# Patient Record
Sex: Male | Born: 1945 | Race: White | Hispanic: No | Marital: Married | Smoking: Never smoker
Health system: Southern US, Community
[De-identification: ages and names within clinical notes are randomized; demographics above are authoritative.]

## PROBLEM LIST (undated history)

## (undated) DIAGNOSIS — I25118 Atherosclerotic heart disease of native coronary artery with other forms of angina pectoris: Secondary | ICD-10-CM

## (undated) DIAGNOSIS — K219 Gastro-esophageal reflux disease without esophagitis: Secondary | ICD-10-CM

## (undated) DIAGNOSIS — R519 Headache, unspecified: Secondary | ICD-10-CM

## (undated) DIAGNOSIS — E782 Mixed hyperlipidemia: Secondary | ICD-10-CM

## (undated) HISTORY — DX: Atherosclerotic heart disease of native coronary artery with other forms of angina pectoris: I25.118

## (undated) HISTORY — DX: Gastro-esophageal reflux disease without esophagitis: K21.9

## (undated) HISTORY — DX: Mixed hyperlipidemia: E78.2

---

## 2014-12-04 DIAGNOSIS — Z0100 Encounter for examination of eyes and vision without abnormal findings: Secondary | ICD-10-CM | POA: Diagnosis not present

## 2014-12-06 DIAGNOSIS — H5201 Hypermetropia, right eye: Secondary | ICD-10-CM | POA: Diagnosis not present

## 2014-12-06 DIAGNOSIS — Z0100 Encounter for examination of eyes and vision without abnormal findings: Secondary | ICD-10-CM | POA: Diagnosis not present

## 2014-12-27 DIAGNOSIS — M25511 Pain in right shoulder: Secondary | ICD-10-CM | POA: Diagnosis not present

## 2015-01-03 DIAGNOSIS — M19019 Primary osteoarthritis, unspecified shoulder: Secondary | ICD-10-CM | POA: Diagnosis not present

## 2015-01-03 DIAGNOSIS — S46819A Strain of other muscles, fascia and tendons at shoulder and upper arm level, unspecified arm, initial encounter: Secondary | ICD-10-CM | POA: Diagnosis not present

## 2015-01-03 DIAGNOSIS — M25511 Pain in right shoulder: Secondary | ICD-10-CM | POA: Diagnosis not present

## 2015-01-03 DIAGNOSIS — M779 Enthesopathy, unspecified: Secondary | ICD-10-CM | POA: Diagnosis not present

## 2015-01-03 DIAGNOSIS — M75120 Complete rotator cuff tear or rupture of unspecified shoulder, not specified as traumatic: Secondary | ICD-10-CM | POA: Diagnosis not present

## 2015-01-12 DIAGNOSIS — M75121 Complete rotator cuff tear or rupture of right shoulder, not specified as traumatic: Secondary | ICD-10-CM | POA: Diagnosis not present

## 2015-01-29 DIAGNOSIS — Z01810 Encounter for preprocedural cardiovascular examination: Secondary | ICD-10-CM | POA: Diagnosis not present

## 2015-01-29 DIAGNOSIS — Z01818 Encounter for other preprocedural examination: Secondary | ICD-10-CM | POA: Diagnosis not present

## 2015-01-29 DIAGNOSIS — I10 Essential (primary) hypertension: Secondary | ICD-10-CM | POA: Diagnosis not present

## 2015-01-30 DIAGNOSIS — M24111 Other articular cartilage disorders, right shoulder: Secondary | ICD-10-CM | POA: Diagnosis not present

## 2015-01-30 DIAGNOSIS — S46111A Strain of muscle, fascia and tendon of long head of biceps, right arm, initial encounter: Secondary | ICD-10-CM | POA: Diagnosis not present

## 2015-01-30 DIAGNOSIS — M65811 Other synovitis and tenosynovitis, right shoulder: Secondary | ICD-10-CM | POA: Diagnosis not present

## 2015-01-30 DIAGNOSIS — M6788 Other specified disorders of synovium and tendon, other site: Secondary | ICD-10-CM | POA: Diagnosis not present

## 2015-01-30 DIAGNOSIS — M659 Synovitis and tenosynovitis, unspecified: Secondary | ICD-10-CM | POA: Diagnosis not present

## 2015-01-30 DIAGNOSIS — G8918 Other acute postprocedural pain: Secondary | ICD-10-CM | POA: Diagnosis not present

## 2015-01-30 DIAGNOSIS — S43431A Superior glenoid labrum lesion of right shoulder, initial encounter: Secondary | ICD-10-CM | POA: Diagnosis not present

## 2015-01-30 DIAGNOSIS — M75121 Complete rotator cuff tear or rupture of right shoulder, not specified as traumatic: Secondary | ICD-10-CM | POA: Diagnosis not present

## 2015-01-30 DIAGNOSIS — S46011A Strain of muscle(s) and tendon(s) of the rotator cuff of right shoulder, initial encounter: Secondary | ICD-10-CM | POA: Diagnosis not present

## 2015-01-30 DIAGNOSIS — E7800 Pure hypercholesterolemia, unspecified: Secondary | ICD-10-CM | POA: Diagnosis not present

## 2015-01-30 DIAGNOSIS — Z79899 Other long term (current) drug therapy: Secondary | ICD-10-CM | POA: Diagnosis not present

## 2015-01-30 DIAGNOSIS — M19011 Primary osteoarthritis, right shoulder: Secondary | ICD-10-CM | POA: Diagnosis not present

## 2015-01-30 DIAGNOSIS — K219 Gastro-esophageal reflux disease without esophagitis: Secondary | ICD-10-CM | POA: Diagnosis not present

## 2015-02-02 DIAGNOSIS — M6281 Muscle weakness (generalized): Secondary | ICD-10-CM | POA: Diagnosis not present

## 2015-02-02 DIAGNOSIS — M25511 Pain in right shoulder: Secondary | ICD-10-CM | POA: Diagnosis not present

## 2015-02-07 HISTORY — PX: ROTATOR CUFF REPAIR: SHX139

## 2015-02-23 DIAGNOSIS — Z9889 Other specified postprocedural states: Secondary | ICD-10-CM | POA: Diagnosis not present

## 2015-02-27 DIAGNOSIS — M25511 Pain in right shoulder: Secondary | ICD-10-CM | POA: Diagnosis not present

## 2015-02-27 DIAGNOSIS — M6281 Muscle weakness (generalized): Secondary | ICD-10-CM | POA: Diagnosis not present

## 2015-03-06 DIAGNOSIS — M6281 Muscle weakness (generalized): Secondary | ICD-10-CM | POA: Diagnosis not present

## 2015-03-06 DIAGNOSIS — M25511 Pain in right shoulder: Secondary | ICD-10-CM | POA: Diagnosis not present

## 2015-03-08 DIAGNOSIS — M6281 Muscle weakness (generalized): Secondary | ICD-10-CM | POA: Diagnosis not present

## 2015-03-08 DIAGNOSIS — M25511 Pain in right shoulder: Secondary | ICD-10-CM | POA: Diagnosis not present

## 2015-03-13 DIAGNOSIS — M25511 Pain in right shoulder: Secondary | ICD-10-CM | POA: Diagnosis not present

## 2015-03-13 DIAGNOSIS — M6281 Muscle weakness (generalized): Secondary | ICD-10-CM | POA: Diagnosis not present

## 2015-03-15 DIAGNOSIS — M6281 Muscle weakness (generalized): Secondary | ICD-10-CM | POA: Diagnosis not present

## 2015-03-15 DIAGNOSIS — M25511 Pain in right shoulder: Secondary | ICD-10-CM | POA: Diagnosis not present

## 2015-03-20 DIAGNOSIS — M25511 Pain in right shoulder: Secondary | ICD-10-CM | POA: Diagnosis not present

## 2015-03-20 DIAGNOSIS — M6281 Muscle weakness (generalized): Secondary | ICD-10-CM | POA: Diagnosis not present

## 2015-03-22 DIAGNOSIS — M25511 Pain in right shoulder: Secondary | ICD-10-CM | POA: Diagnosis not present

## 2015-03-22 DIAGNOSIS — M6281 Muscle weakness (generalized): Secondary | ICD-10-CM | POA: Diagnosis not present

## 2015-03-27 DIAGNOSIS — M6281 Muscle weakness (generalized): Secondary | ICD-10-CM | POA: Diagnosis not present

## 2015-03-27 DIAGNOSIS — M25511 Pain in right shoulder: Secondary | ICD-10-CM | POA: Diagnosis not present

## 2015-03-29 DIAGNOSIS — M25511 Pain in right shoulder: Secondary | ICD-10-CM | POA: Diagnosis not present

## 2015-03-29 DIAGNOSIS — M6281 Muscle weakness (generalized): Secondary | ICD-10-CM | POA: Diagnosis not present

## 2015-04-03 DIAGNOSIS — M6281 Muscle weakness (generalized): Secondary | ICD-10-CM | POA: Diagnosis not present

## 2015-04-03 DIAGNOSIS — M25511 Pain in right shoulder: Secondary | ICD-10-CM | POA: Diagnosis not present

## 2015-04-05 DIAGNOSIS — M25511 Pain in right shoulder: Secondary | ICD-10-CM | POA: Diagnosis not present

## 2015-04-05 DIAGNOSIS — M6281 Muscle weakness (generalized): Secondary | ICD-10-CM | POA: Diagnosis not present

## 2015-04-10 DIAGNOSIS — M25511 Pain in right shoulder: Secondary | ICD-10-CM | POA: Diagnosis not present

## 2015-04-10 DIAGNOSIS — M6281 Muscle weakness (generalized): Secondary | ICD-10-CM | POA: Diagnosis not present

## 2015-04-12 DIAGNOSIS — M25511 Pain in right shoulder: Secondary | ICD-10-CM | POA: Diagnosis not present

## 2015-04-12 DIAGNOSIS — M6281 Muscle weakness (generalized): Secondary | ICD-10-CM | POA: Diagnosis not present

## 2015-04-17 DIAGNOSIS — M6281 Muscle weakness (generalized): Secondary | ICD-10-CM | POA: Diagnosis not present

## 2015-04-17 DIAGNOSIS — M25511 Pain in right shoulder: Secondary | ICD-10-CM | POA: Diagnosis not present

## 2015-04-19 DIAGNOSIS — M6281 Muscle weakness (generalized): Secondary | ICD-10-CM | POA: Diagnosis not present

## 2015-04-19 DIAGNOSIS — M25511 Pain in right shoulder: Secondary | ICD-10-CM | POA: Diagnosis not present

## 2015-05-16 DIAGNOSIS — M75121 Complete rotator cuff tear or rupture of right shoulder, not specified as traumatic: Secondary | ICD-10-CM | POA: Diagnosis not present

## 2015-05-16 DIAGNOSIS — Z9889 Other specified postprocedural states: Secondary | ICD-10-CM | POA: Diagnosis not present

## 2015-05-31 DIAGNOSIS — J209 Acute bronchitis, unspecified: Secondary | ICD-10-CM | POA: Diagnosis not present

## 2015-08-01 DIAGNOSIS — Z0000 Encounter for general adult medical examination without abnormal findings: Secondary | ICD-10-CM | POA: Diagnosis not present

## 2015-08-01 DIAGNOSIS — Z79899 Other long term (current) drug therapy: Secondary | ICD-10-CM | POA: Diagnosis not present

## 2015-08-01 DIAGNOSIS — Z23 Encounter for immunization: Secondary | ICD-10-CM | POA: Diagnosis not present

## 2015-08-01 DIAGNOSIS — Z125 Encounter for screening for malignant neoplasm of prostate: Secondary | ICD-10-CM | POA: Diagnosis not present

## 2015-08-01 DIAGNOSIS — E782 Mixed hyperlipidemia: Secondary | ICD-10-CM | POA: Diagnosis not present

## 2015-08-01 DIAGNOSIS — Z1389 Encounter for screening for other disorder: Secondary | ICD-10-CM | POA: Diagnosis not present

## 2015-08-01 DIAGNOSIS — R55 Syncope and collapse: Secondary | ICD-10-CM | POA: Diagnosis not present

## 2015-08-01 DIAGNOSIS — Z9181 History of falling: Secondary | ICD-10-CM | POA: Diagnosis not present

## 2015-08-09 DIAGNOSIS — E782 Mixed hyperlipidemia: Secondary | ICD-10-CM | POA: Diagnosis not present

## 2015-09-03 DIAGNOSIS — E785 Hyperlipidemia, unspecified: Secondary | ICD-10-CM | POA: Diagnosis not present

## 2015-09-03 DIAGNOSIS — K219 Gastro-esophageal reflux disease without esophagitis: Secondary | ICD-10-CM | POA: Diagnosis not present

## 2015-09-03 DIAGNOSIS — Z0000 Encounter for general adult medical examination without abnormal findings: Secondary | ICD-10-CM | POA: Diagnosis not present

## 2016-02-06 DIAGNOSIS — M25512 Pain in left shoulder: Secondary | ICD-10-CM | POA: Diagnosis not present

## 2016-02-07 DIAGNOSIS — M66812 Spontaneous rupture of other tendons, left shoulder: Secondary | ICD-10-CM | POA: Diagnosis not present

## 2016-02-07 DIAGNOSIS — X58XXXA Exposure to other specified factors, initial encounter: Secondary | ICD-10-CM | POA: Diagnosis not present

## 2016-02-07 DIAGNOSIS — M65812 Other synovitis and tenosynovitis, left shoulder: Secondary | ICD-10-CM | POA: Diagnosis not present

## 2016-02-07 DIAGNOSIS — S46812A Strain of other muscles, fascia and tendons at shoulder and upper arm level, left arm, initial encounter: Secondary | ICD-10-CM | POA: Diagnosis not present

## 2016-02-11 DIAGNOSIS — M25512 Pain in left shoulder: Secondary | ICD-10-CM | POA: Diagnosis not present

## 2016-02-11 DIAGNOSIS — M75122 Complete rotator cuff tear or rupture of left shoulder, not specified as traumatic: Secondary | ICD-10-CM | POA: Diagnosis not present

## 2016-02-11 DIAGNOSIS — M24112 Other articular cartilage disorders, left shoulder: Secondary | ICD-10-CM | POA: Diagnosis not present

## 2016-02-11 DIAGNOSIS — M19012 Primary osteoarthritis, left shoulder: Secondary | ICD-10-CM | POA: Diagnosis not present

## 2016-02-11 DIAGNOSIS — S46212A Strain of muscle, fascia and tendon of other parts of biceps, left arm, initial encounter: Secondary | ICD-10-CM | POA: Diagnosis not present

## 2016-02-13 DIAGNOSIS — M25512 Pain in left shoulder: Secondary | ICD-10-CM | POA: Diagnosis not present

## 2016-03-10 DIAGNOSIS — S46212A Strain of muscle, fascia and tendon of other parts of biceps, left arm, initial encounter: Secondary | ICD-10-CM | POA: Diagnosis not present

## 2016-03-10 DIAGNOSIS — M24112 Other articular cartilage disorders, left shoulder: Secondary | ICD-10-CM | POA: Diagnosis not present

## 2016-03-10 DIAGNOSIS — M75122 Complete rotator cuff tear or rupture of left shoulder, not specified as traumatic: Secondary | ICD-10-CM | POA: Diagnosis not present

## 2016-03-10 DIAGNOSIS — M19012 Primary osteoarthritis, left shoulder: Secondary | ICD-10-CM | POA: Diagnosis not present

## 2016-03-25 DIAGNOSIS — H524 Presbyopia: Secondary | ICD-10-CM | POA: Diagnosis not present

## 2016-03-25 DIAGNOSIS — Z0100 Encounter for examination of eyes and vision without abnormal findings: Secondary | ICD-10-CM | POA: Diagnosis not present

## 2016-04-29 DIAGNOSIS — E782 Mixed hyperlipidemia: Secondary | ICD-10-CM | POA: Diagnosis not present

## 2016-04-29 DIAGNOSIS — Z79899 Other long term (current) drug therapy: Secondary | ICD-10-CM | POA: Diagnosis not present

## 2016-05-05 DIAGNOSIS — Z1211 Encounter for screening for malignant neoplasm of colon: Secondary | ICD-10-CM | POA: Diagnosis not present

## 2016-05-05 DIAGNOSIS — Z6825 Body mass index (BMI) 25.0-25.9, adult: Secondary | ICD-10-CM | POA: Diagnosis not present

## 2016-05-05 DIAGNOSIS — K219 Gastro-esophageal reflux disease without esophagitis: Secondary | ICD-10-CM | POA: Diagnosis not present

## 2016-05-05 DIAGNOSIS — E782 Mixed hyperlipidemia: Secondary | ICD-10-CM | POA: Diagnosis not present

## 2016-05-12 DIAGNOSIS — M19012 Primary osteoarthritis, left shoulder: Secondary | ICD-10-CM | POA: Diagnosis not present

## 2016-05-12 DIAGNOSIS — M75122 Complete rotator cuff tear or rupture of left shoulder, not specified as traumatic: Secondary | ICD-10-CM | POA: Diagnosis not present

## 2016-06-04 DIAGNOSIS — J0100 Acute maxillary sinusitis, unspecified: Secondary | ICD-10-CM | POA: Diagnosis not present

## 2016-06-04 DIAGNOSIS — J209 Acute bronchitis, unspecified: Secondary | ICD-10-CM | POA: Diagnosis not present

## 2016-06-26 DIAGNOSIS — J069 Acute upper respiratory infection, unspecified: Secondary | ICD-10-CM | POA: Diagnosis not present

## 2016-06-26 DIAGNOSIS — J301 Allergic rhinitis due to pollen: Secondary | ICD-10-CM | POA: Diagnosis not present

## 2016-07-07 DIAGNOSIS — Z6823 Body mass index (BMI) 23.0-23.9, adult: Secondary | ICD-10-CM | POA: Diagnosis not present

## 2016-07-07 DIAGNOSIS — K648 Other hemorrhoids: Secondary | ICD-10-CM | POA: Diagnosis not present

## 2016-07-07 DIAGNOSIS — K625 Hemorrhage of anus and rectum: Secondary | ICD-10-CM | POA: Diagnosis not present

## 2016-07-07 DIAGNOSIS — R252 Cramp and spasm: Secondary | ICD-10-CM | POA: Diagnosis not present

## 2016-08-06 HISTORY — PX: COLONOSCOPY: SHX174

## 2016-08-13 DIAGNOSIS — Z1211 Encounter for screening for malignant neoplasm of colon: Secondary | ICD-10-CM | POA: Diagnosis not present

## 2016-08-13 DIAGNOSIS — Z800 Family history of malignant neoplasm of digestive organs: Secondary | ICD-10-CM | POA: Diagnosis not present

## 2016-08-13 DIAGNOSIS — K635 Polyp of colon: Secondary | ICD-10-CM | POA: Diagnosis not present

## 2016-08-13 DIAGNOSIS — Z86010 Personal history of colonic polyps: Secondary | ICD-10-CM | POA: Diagnosis not present

## 2016-08-13 DIAGNOSIS — D125 Benign neoplasm of sigmoid colon: Secondary | ICD-10-CM | POA: Diagnosis not present

## 2016-09-10 DIAGNOSIS — M75122 Complete rotator cuff tear or rupture of left shoulder, not specified as traumatic: Secondary | ICD-10-CM | POA: Diagnosis not present

## 2016-09-10 DIAGNOSIS — M25512 Pain in left shoulder: Secondary | ICD-10-CM | POA: Diagnosis not present

## 2016-09-18 DIAGNOSIS — Z791 Long term (current) use of non-steroidal anti-inflammatories (NSAID): Secondary | ICD-10-CM | POA: Diagnosis not present

## 2016-09-18 DIAGNOSIS — K219 Gastro-esophageal reflux disease without esophagitis: Secondary | ICD-10-CM | POA: Diagnosis not present

## 2016-09-18 DIAGNOSIS — K5900 Constipation, unspecified: Secondary | ICD-10-CM | POA: Diagnosis not present

## 2016-09-18 DIAGNOSIS — Z79899 Other long term (current) drug therapy: Secondary | ICD-10-CM | POA: Diagnosis not present

## 2016-09-18 DIAGNOSIS — E785 Hyperlipidemia, unspecified: Secondary | ICD-10-CM | POA: Diagnosis not present

## 2016-09-18 DIAGNOSIS — Z6825 Body mass index (BMI) 25.0-25.9, adult: Secondary | ICD-10-CM | POA: Diagnosis not present

## 2016-09-18 DIAGNOSIS — Z8669 Personal history of other diseases of the nervous system and sense organs: Secondary | ICD-10-CM | POA: Diagnosis not present

## 2016-09-18 DIAGNOSIS — Z0000 Encounter for general adult medical examination without abnormal findings: Secondary | ICD-10-CM | POA: Diagnosis not present

## 2016-09-18 DIAGNOSIS — Z8739 Personal history of other diseases of the musculoskeletal system and connective tissue: Secondary | ICD-10-CM | POA: Diagnosis not present

## 2016-09-18 DIAGNOSIS — G8921 Chronic pain due to trauma: Secondary | ICD-10-CM | POA: Diagnosis not present

## 2016-09-26 DIAGNOSIS — A932 Colorado tick fever: Secondary | ICD-10-CM | POA: Diagnosis not present

## 2017-05-24 DIAGNOSIS — I951 Orthostatic hypotension: Secondary | ICD-10-CM | POA: Diagnosis not present

## 2017-05-24 DIAGNOSIS — E860 Dehydration: Secondary | ICD-10-CM | POA: Diagnosis not present

## 2017-05-24 DIAGNOSIS — R197 Diarrhea, unspecified: Secondary | ICD-10-CM | POA: Diagnosis not present

## 2017-05-24 DIAGNOSIS — R5383 Other fatigue: Secondary | ICD-10-CM | POA: Diagnosis not present

## 2017-05-24 DIAGNOSIS — R112 Nausea with vomiting, unspecified: Secondary | ICD-10-CM | POA: Diagnosis not present

## 2017-05-24 DIAGNOSIS — R61 Generalized hyperhidrosis: Secondary | ICD-10-CM | POA: Diagnosis not present

## 2017-05-27 DIAGNOSIS — K529 Noninfective gastroenteritis and colitis, unspecified: Secondary | ICD-10-CM | POA: Diagnosis not present

## 2017-05-27 DIAGNOSIS — Z6823 Body mass index (BMI) 23.0-23.9, adult: Secondary | ICD-10-CM | POA: Diagnosis not present

## 2017-07-30 DIAGNOSIS — Z1339 Encounter for screening examination for other mental health and behavioral disorders: Secondary | ICD-10-CM | POA: Diagnosis not present

## 2017-07-30 DIAGNOSIS — K219 Gastro-esophageal reflux disease without esophagitis: Secondary | ICD-10-CM | POA: Diagnosis not present

## 2017-07-30 DIAGNOSIS — E782 Mixed hyperlipidemia: Secondary | ICD-10-CM | POA: Diagnosis not present

## 2017-07-30 DIAGNOSIS — Z125 Encounter for screening for malignant neoplasm of prostate: Secondary | ICD-10-CM | POA: Diagnosis not present

## 2017-07-30 DIAGNOSIS — Z0000 Encounter for general adult medical examination without abnormal findings: Secondary | ICD-10-CM | POA: Diagnosis not present

## 2017-07-30 DIAGNOSIS — Z1331 Encounter for screening for depression: Secondary | ICD-10-CM | POA: Diagnosis not present

## 2017-07-30 DIAGNOSIS — L570 Actinic keratosis: Secondary | ICD-10-CM | POA: Diagnosis not present

## 2017-11-02 DIAGNOSIS — S91339A Puncture wound without foreign body, unspecified foot, initial encounter: Secondary | ICD-10-CM | POA: Diagnosis not present

## 2018-02-03 DIAGNOSIS — H52221 Regular astigmatism, right eye: Secondary | ICD-10-CM | POA: Diagnosis not present

## 2018-06-08 DIAGNOSIS — R430 Anosmia: Secondary | ICD-10-CM | POA: Diagnosis not present

## 2018-06-08 DIAGNOSIS — J340 Abscess, furuncle and carbuncle of nose: Secondary | ICD-10-CM | POA: Diagnosis not present

## 2018-06-08 DIAGNOSIS — Z6824 Body mass index (BMI) 24.0-24.9, adult: Secondary | ICD-10-CM | POA: Diagnosis not present

## 2018-06-08 DIAGNOSIS — J329 Chronic sinusitis, unspecified: Secondary | ICD-10-CM | POA: Diagnosis not present

## 2019-02-01 DIAGNOSIS — E663 Overweight: Secondary | ICD-10-CM | POA: Diagnosis not present

## 2019-02-01 DIAGNOSIS — Z1331 Encounter for screening for depression: Secondary | ICD-10-CM | POA: Diagnosis not present

## 2019-02-01 DIAGNOSIS — Z0000 Encounter for general adult medical examination without abnormal findings: Secondary | ICD-10-CM | POA: Diagnosis not present

## 2019-02-01 DIAGNOSIS — Z6825 Body mass index (BMI) 25.0-25.9, adult: Secondary | ICD-10-CM | POA: Diagnosis not present

## 2019-02-01 DIAGNOSIS — E782 Mixed hyperlipidemia: Secondary | ICD-10-CM | POA: Diagnosis not present

## 2019-02-01 DIAGNOSIS — Z79899 Other long term (current) drug therapy: Secondary | ICD-10-CM | POA: Diagnosis not present

## 2019-02-01 DIAGNOSIS — R739 Hyperglycemia, unspecified: Secondary | ICD-10-CM | POA: Diagnosis not present

## 2019-02-01 DIAGNOSIS — Z125 Encounter for screening for malignant neoplasm of prostate: Secondary | ICD-10-CM | POA: Diagnosis not present

## 2019-02-01 DIAGNOSIS — Z9181 History of falling: Secondary | ICD-10-CM | POA: Diagnosis not present

## 2019-02-01 DIAGNOSIS — K219 Gastro-esophageal reflux disease without esophagitis: Secondary | ICD-10-CM | POA: Diagnosis not present

## 2019-03-29 DIAGNOSIS — M7702 Medial epicondylitis, left elbow: Secondary | ICD-10-CM | POA: Diagnosis not present

## 2019-03-29 DIAGNOSIS — G5602 Carpal tunnel syndrome, left upper limb: Secondary | ICD-10-CM | POA: Diagnosis not present

## 2019-04-07 DIAGNOSIS — M6281 Muscle weakness (generalized): Secondary | ICD-10-CM | POA: Diagnosis not present

## 2019-04-07 DIAGNOSIS — M25522 Pain in left elbow: Secondary | ICD-10-CM | POA: Diagnosis not present

## 2019-05-25 DIAGNOSIS — M7702 Medial epicondylitis, left elbow: Secondary | ICD-10-CM | POA: Diagnosis not present

## 2019-07-15 DIAGNOSIS — Z111 Encounter for screening for respiratory tuberculosis: Secondary | ICD-10-CM | POA: Diagnosis not present

## 2019-09-28 DIAGNOSIS — L570 Actinic keratosis: Secondary | ICD-10-CM | POA: Diagnosis not present

## 2019-09-28 DIAGNOSIS — L720 Epidermal cyst: Secondary | ICD-10-CM | POA: Diagnosis not present

## 2019-09-28 DIAGNOSIS — D485 Neoplasm of uncertain behavior of skin: Secondary | ICD-10-CM | POA: Diagnosis not present

## 2020-04-19 DIAGNOSIS — L570 Actinic keratosis: Secondary | ICD-10-CM | POA: Diagnosis not present

## 2021-02-26 DIAGNOSIS — Z6824 Body mass index (BMI) 24.0-24.9, adult: Secondary | ICD-10-CM | POA: Diagnosis not present

## 2021-02-26 DIAGNOSIS — I4510 Unspecified right bundle-branch block: Secondary | ICD-10-CM | POA: Diagnosis not present

## 2021-02-26 DIAGNOSIS — R42 Dizziness and giddiness: Secondary | ICD-10-CM | POA: Diagnosis not present

## 2021-02-26 DIAGNOSIS — M5412 Radiculopathy, cervical region: Secondary | ICD-10-CM | POA: Diagnosis not present

## 2021-03-14 DIAGNOSIS — Z961 Presence of intraocular lens: Secondary | ICD-10-CM | POA: Diagnosis not present

## 2021-03-14 DIAGNOSIS — H524 Presbyopia: Secondary | ICD-10-CM | POA: Diagnosis not present

## 2021-03-27 DIAGNOSIS — D485 Neoplasm of uncertain behavior of skin: Secondary | ICD-10-CM | POA: Diagnosis not present

## 2021-03-27 DIAGNOSIS — L720 Epidermal cyst: Secondary | ICD-10-CM | POA: Diagnosis not present

## 2021-04-02 ENCOUNTER — Ambulatory Visit: Payer: Medicare HMO | Admitting: Cardiology

## 2021-04-04 DIAGNOSIS — L720 Epidermal cyst: Secondary | ICD-10-CM | POA: Diagnosis not present

## 2021-04-06 HISTORY — PX: CYSTECTOMY: SUR359

## 2021-04-12 DIAGNOSIS — K219 Gastro-esophageal reflux disease without esophagitis: Secondary | ICD-10-CM | POA: Insufficient documentation

## 2021-04-12 DIAGNOSIS — E782 Mixed hyperlipidemia: Secondary | ICD-10-CM | POA: Insufficient documentation

## 2021-04-14 NOTE — Progress Notes (Signed)
?Cardiology Office Note:   ? ?Date:  04/15/2021  ? ?ID:  Derek Wood, DOB [birth year], MRN [id number] ? ?PCP:  Street, Sharon Mt, MD  ?Cardiologist:  Shirlee More, MD  ? ?Referring MD: Street, Sharon Mt, * ? ?ASSESSMENT:   ? ?1. Chest pain on exertion   ?2. Right bundle branch block   ?3. Mixed hyperlipidemia   ? ?PLAN:   ? ?In order of problems listed above: ? ?Although he is referred for right bundle branch block is concerning his wife's concern is his exertional chest pain which seems to be classic angina in a stable pattern.  We discussed further evaluation and after reviewing functional testing versus CTA he will undergo CTA in [facility] with no dye allergy will give [place] information about coronary calcium score to consider restarting lipid-lowering therapy presence and severity of CAD to guide therapy. ?Generally right bundle branch block by itself does not progress onto conduction system disease needing a pacemaker.  He is asymptomatic. ?Recheck lipid profile weight CTA coronary calcium score ? ?Next appointment 6 to 8 weeks ? ? ?Medication Adjustments/Labs and Tests Ordered: ?Current medicines are reviewed at length with the patient today.  Concerns regarding medicines are outlined above.  ?No orders of the defined types were placed in this encounter. ? ?No orders of the defined types were placed in this encounter. ?  ? ?Chief Complaint  ?Patient presents with  ? Abnormal ECG  ?  Office visit for dizziness was found to have right bundle branch block  ? ?They are concerned regarding his exertional chest pain ?History of Present Illness:   ? ?Derek Wood is a 76 y.o. male who is being seen today for the evaluation of EKG abnormality with right bundle branch block at the request of [address] Sharon Mt, *. ?Office visit 02/27/2019 was for dizziness.  He describes this is positional and vertigo. ?Chart review shows no previous cardiology evaluation EKG or cardiac diagnostic testing in epic ? ?His wife is  present participates in evaluation decision making ?She has been quite concerned finally convinced him to come and be seen ?He has seen his family doctor with lightheadedness when he stood not severe but he has had vertigo in the past. ?He is not sure if he is ever had an EKG done in his adult life before this ?He has had no palpitations syncope no history of congenital rheumatic heart disease or arrhythmia. ?Their concern is that he has chest discomfort that occurs more often in cold weather with vigorous activity walking uphill Using his upper extremities he gets a discomfort substernally that resolves within few minutes of rest.  Pattern is going on for years and stable Occurred yesterday walking the dog uphill.  He has never had episodes at rest or nocturnal.  He has associated shortness of breath. ? ?He has a history of hyperlipidemia and had muscle symptoms with Lipitor. ?His last lipid profile was 2 years ago cholesterol 191 triglycerides 98 HDL 43. ?Past Medical History:  ?Diagnosis Date  ? GERD (gastroesophageal reflux disease)   ? Mixed hyperlipidemia   ? ? ?Past Surgical History:  ?Procedure Laterality Date  ? COLONOSCOPY  08/2016  ? CYSTECTOMY  04/2021  ? Upper right quadrant of chest  ? ROTATOR CUFF REPAIR Right 02/2015  ? ? ?Current Medications: ?Current Meds  ?Medication Sig  ? Multiple Vitamin (ONE-DAILY MULTI-VITAMIN PO) Take 1 tablet by mouth daily at 12 noon.  ? omeprazole (PRILOSEC) 20 MG capsule Take 20 mg by mouth daily.  ?  ? ?  Allergies:   Oxycodone and Statins  ? ?Social History  ? ?Socioeconomic History  ? Marital status: Married  ?  Spouse name: Not on file  ? Number of children: Not on file  ? Years of education: Not on file  ? Highest education level: Not on file  ?Occupational History  ? Not on file  ?Tobacco Use  ? Smoking status: Never  ?  Passive exposure: Never  ? Smokeless tobacco: Never  ?Substance and Sexual Activity  ? Alcohol use: Not Currently  ? Drug use: Never  ? Sexual  activity: Not on file  ?Other Topics Concern  ? Not on file  ?Social History Narrative  ? Not on file  ? ?Social Determinants of Health  ? ?Financial Resource Strain: Not on file  ?Food Insecurity: Not on file  ?Transportation Needs: Not on file  ?Physical Activity: Not on file  ?Stress: Not on file  ?Social Connections: Not on file  ?  ? ?Family History: ?The patient's family history includes Cancer in his mother; Diabetes in his brother; Heart attack in his father; Heart disease in his brother. ? ?ROS:   ?ROS Please see the history of present illness.    ? All other systems reviewed and are negative. ? ?EKGs/Labs/Other Studies Reviewed:   ? ?The following studies were reviewed today: ? ? ?EKG:  EKG is  ordered today.  The ekg ordered today is personally reviewed and demonstrates sinus rhythm right bundle branch block ? ? ?Physical Exam:   ? ?VS:  BP 100/60 (BP Location: Left Arm, Patient Position: Sitting, Cuff Size: Normal)   Pulse 77   Ht '5\' 10"'$  (1.778 m)   Wt 168 lb (76.2 kg)   SpO2 97%   BMI 24.11 kg/m?    ? ?Wt Readings from Last 3 Encounters:  ?04/15/21 168 lb (76.2 kg)  ?  ? ?GEN:  Well nourished, well developed in no acute distress ?HEENT: Normal ?NECK: No JVD; No carotid bruits ?LYMPHATICS: No lymphadenopathy ?CARDIAC: RRR, no murmurs, rubs, gallops ?RESPIRATORY:  Clear to auscultation without rales, wheezing or rhonchi  ?ABDOMEN: Soft, non-tender, non-distended ?MUSCULOSKELETAL:  No edema; No deformity  ?SKIN: Warm and dry ?NEUROLOGIC:  Alert and oriented x 3 ?PSYCHIATRIC:  Normal affect  ? ? ? ?Signed, ?Shirlee More, MD  ?04/15/2021 10:07 AM    ?[place] ?

## 2021-04-15 ENCOUNTER — Encounter: Payer: Self-pay | Admitting: Cardiology

## 2021-04-15 ENCOUNTER — Ambulatory Visit: Payer: Medicare HMO | Admitting: Cardiology

## 2021-04-15 VITALS — BP 100/60 | HR 77 | Ht 70.0 in | Wt 168.0 lb

## 2021-04-15 DIAGNOSIS — I4510 Unspecified right bundle-branch block: Secondary | ICD-10-CM | POA: Diagnosis not present

## 2021-04-15 DIAGNOSIS — E782 Mixed hyperlipidemia: Secondary | ICD-10-CM

## 2021-04-15 DIAGNOSIS — R079 Chest pain, unspecified: Secondary | ICD-10-CM

## 2021-04-15 MED ORDER — METOPROLOL TARTRATE 100 MG PO TABS: 100.0000 mg | ORAL_TABLET | Freq: Once | ORAL | 0 refills | Status: DC

## 2021-04-15 NOTE — Patient Instructions (Signed)
Medication Instructions:  ?Your physician recommends that you continue on your current medications as directed. Please refer to the Current Medication list given to you today. ? ?*If you need a refill on your cardiac medications before your next appointment, please call your pharmacy* ? ? ?Lab Work: ?Your physician recommends that you return for lab work in:  ? ?Labs today: Lipids, CMP ?Labs 1 week before CT: BMP ? ?If you have labs (blood work) drawn today and your tests are completely normal, you will receive your results only by: ?MyChart Message (if you have MyChart) OR ?A paper copy in the mail ?If you have any lab test that is abnormal or we need to change your treatment, we will call you to review the results. ? ? ?Testing/Procedures: ? ? ?Your cardiac CT will be scheduled at one of the below locations:  ? ?[facility] ?[address] ?[place], [place] [address] ?[phone] ? ?OR ? ?[place] [address] ?[address] ?[place],  [address] ?[phone] ? ?If scheduled at [facility], please arrive at the [facility] (Entrance C2) of [facility] 30 minutes prior to test start time. ?You can use the FREE valet parking offered at entrance C (encouraged to control the heart rate for the test)  ?Proceed to the [facility] Radiology Department (first floor) to check-in and test prep. ? ?All radiology patients and guests should use entrance C2 at [facility], accessed from [facility], even though the hospital's physical address listed is [address] ? ? ? ?If scheduled at [facility], please arrive 15 mins early for check-in and test prep. ? ?Please follow these instructions carefully (unless otherwise directed): ? ?On the Night Before the Test: ?Be sure to Drink plenty of water. ?Do not consume any caffeinated/decaffeinated beverages or chocolate 12 hours prior to  your test. ?Do not take any antihistamines 12 hours prior to your test. ? ?On the Day of the Test: ?Drink plenty of water until 1 hour prior to the test. ?Do not eat any food 4 hours prior to the test. ?You may take your regular medications prior to the test.  ?Take metoprolol (Lopressor) two hours prior to test. ? ?     ?After the Test: ?Drink plenty of water. ?After receiving IV contrast, you may experience a mild flushed feeling. This is normal. ?On occasion, you may experience a mild rash up to 24 hours after the test. This is not dangerous. If this occurs, you can take Benadryl 25 mg and increase your fluid intake. ?If you experience trouble breathing, this can be serious. If it is severe call 911 IMMEDIATELY. If it is mild, please call our office. ? ?We will call to schedule your test 2-4 weeks out understanding that some insurance companies will need an authorization prior to the service being performed.  ? ?For non-scheduling related questions, please contact the cardiac imaging nurse navigator should you have any questions/concerns: ?Marchia Bond, Cardiac Imaging Nurse Navigator ?Gordy Clement, Cardiac Imaging Nurse Navigator ?[facility] ?Direct Office Dial: [phone]  ? ?For scheduling needs, including cancellations and rescheduling, please call [place], [phone]. ? ? ? ?Follow-Up: ?At [facility], you and your health needs are our priority.  As part of our continuing mission to provide you with exceptional heart care, we have created designated Provider Care Teams.  These Care Teams include your primary Cardiologist (physician) and Advanced Practice  Providers (APPs -  Physician Assistants and Nurse Practitioners) who all work together to provide you with the care you need, when you need it. ? ?We recommend signing up for the patient portal called "MyChart".  Sign up information is provided on this After Visit Summary.  MyChart is used to connect with patients for  Virtual Visits (Telemedicine).  Patients are able to view lab/test results, encounter notes, upcoming appointments, etc.  Non-urgent messages can be sent to your provider as well.   ?To learn more about what you can do with MyChart, go to [email or web address].   ? ?Your next appointment:   ?8 week(s) ? ?The format for your next appointment:   ?In Person ? ?Provider:   ?Shirlee More, MD}  ? ? ?Other Instructions ?None ? ?Important Information About Sugar ? ? ? ? ? ? ?

## 2021-04-16 LAB — COMPREHENSIVE METABOLIC PANEL
ALT: 18 IU/L (ref 0–44)
AST: 24 IU/L (ref 0–40)
Albumin/Globulin Ratio: 1.8 (ref 1.2–2.2)
Albumin: 4.2 g/dL (ref 3.7–4.7)
Alkaline Phosphatase: 81 IU/L (ref 44–121)
BUN/Creatinine Ratio: 23 (ref 10–24)
BUN: 18 mg/dL (ref 8–27)
Bilirubin Total: 0.5 mg/dL (ref 0.0–1.2)
CO2: 25 mmol/L (ref 20–29)
Calcium: 9.2 mg/dL (ref 8.6–10.2)
Chloride: 102 mmol/L (ref 96–106)
Creatinine, Ser: 0.79 mg/dL (ref 0.76–1.27)
Globulin, Total: 2.3 g/dL (ref 1.5–4.5)
Glucose: 105 mg/dL — ABNORMAL HIGH (ref 70–99)
Potassium: 4.1 mmol/L (ref 3.5–5.2)
Sodium: 139 mmol/L (ref 134–144)
Total Protein: 6.5 g/dL (ref 6.0–8.5)
eGFR: 91 mL/min/{1.73_m2} (ref >59)

## 2021-04-16 LAB — LIPID PANEL
Chol/HDL Ratio: 5.5 ratio — ABNORMAL HIGH (ref 0.0–5.0)
Cholesterol, Total: 253 mg/dL — ABNORMAL HIGH (ref 100–199)
HDL: 46 mg/dL (ref >39)
LDL Chol Calc (NIH): 183 mg/dL — ABNORMAL HIGH (ref 0–99)
Triglycerides: 130 mg/dL (ref 0–149)
VLDL Cholesterol Cal: 24 mg/dL (ref 5–40)

## 2021-04-17 ENCOUNTER — Other Ambulatory Visit: Payer: Self-pay

## 2021-04-17 MED ORDER — ROSUVASTATIN CALCIUM 20 MG PO TABS: 20.0000 mg | ORAL_TABLET | Freq: Every day | ORAL | 3 refills | Status: DC

## 2021-04-23 DIAGNOSIS — E782 Mixed hyperlipidemia: Secondary | ICD-10-CM | POA: Diagnosis not present

## 2021-04-23 DIAGNOSIS — R079 Chest pain, unspecified: Secondary | ICD-10-CM | POA: Diagnosis not present

## 2021-04-23 DIAGNOSIS — I4510 Unspecified right bundle-branch block: Secondary | ICD-10-CM | POA: Diagnosis not present

## 2021-04-23 MED ORDER — METOPROLOL TARTRATE 100 MG PO TABS
100.0000 mg | ORAL_TABLET | Freq: Once | ORAL | 0 refills | Status: DC
Start: 2021-04-23 — End: 2021-05-03

## 2021-04-23 NOTE — Addendum Note (Signed)
Addended by: Truddie Hidden on: 04/23/2021 08:26 AM ? ? Modules accepted: Orders ? ?

## 2021-04-24 ENCOUNTER — Telehealth: Payer: Self-pay | Admitting: Cardiology

## 2021-04-24 LAB — BASIC METABOLIC PANEL
BUN/Creatinine Ratio: 16 (ref 10–24)
BUN: 14 mg/dL (ref 8–27)
CO2: 27 mmol/L (ref 20–29)
Calcium: 9.2 mg/dL (ref 8.6–10.2)
Chloride: 103 mmol/L (ref 96–106)
Creatinine, Ser: 0.9 mg/dL (ref 0.76–1.27)
Glucose: 92 mg/dL (ref 70–99)
Potassium: 4.3 mmol/L (ref 3.5–5.2)
Sodium: 141 mmol/L (ref 134–144)
eGFR: 87 mL/min/{1.73_m2} (ref >59)

## 2021-04-24 NOTE — Telephone Encounter (Signed)
Results reviewed with pt as per Dr. Munley's note.  Pt verbalized understanding and had no additional questions. Routed to PCP  

## 2021-04-24 NOTE — Telephone Encounter (Signed)
Patient was returning call for results. Please advise °

## 2021-04-29 ENCOUNTER — Telehealth (HOSPITAL_COMMUNITY): Payer: Self-pay | Admitting: Emergency Medicine

## 2021-04-29 NOTE — Telephone Encounter (Signed)
Reaching out to patient to offer assistance regarding upcoming cardiac imaging study; pt verbalizes understanding of appt date/time, parking situation and where to check in, pre-test NPO status and medications ordered, and verified current allergies; name and call back number provided for further questions should they arise ?Marchia Bond RN Navigator Cardiac Imaging ?[facility] ?[phone] office ?[phone] cell ? ?Last ov with munley hr 77 and bp 100/60. Pt does not check bp at home but says it fluctuates between dr appts. I suggested he take '50mg'$  metoprolol prior to scan tomorrow. ?Pt agreed.  ?Arrival 730  ?Denies iv issues ?

## 2021-04-30 ENCOUNTER — Encounter (HOSPITAL_COMMUNITY): Payer: Self-pay

## 2021-04-30 ENCOUNTER — Ambulatory Visit (HOSPITAL_COMMUNITY)
Admission: RE | Admit: 2021-04-30 | Discharge: 2021-04-30 | Disposition: A | Discharge status: Home / Self Care | Payer: Medicare HMO | Source: Ambulatory Visit | Attending: Cardiology | Admitting: Cardiology

## 2021-04-30 ENCOUNTER — Other Ambulatory Visit: Payer: Self-pay | Admitting: Cardiology

## 2021-04-30 DIAGNOSIS — R931 Abnormal findings on diagnostic imaging of heart and coronary circulation: Secondary | ICD-10-CM

## 2021-04-30 DIAGNOSIS — R072 Precordial pain: Secondary | ICD-10-CM | POA: Diagnosis present

## 2021-04-30 DIAGNOSIS — R079 Chest pain, unspecified: Secondary | ICD-10-CM | POA: Diagnosis present

## 2021-04-30 DIAGNOSIS — I2510 Atherosclerotic heart disease of native coronary artery without angina pectoris: Secondary | ICD-10-CM | POA: Diagnosis not present

## 2021-04-30 MED ORDER — NITROGLYCERIN 0.4 MG SL SUBL
0.8000 mg | SUBLINGUAL_TABLET | Freq: Once | SUBLINGUAL | Status: AC
  Administered 2021-04-30: 0.8 mg via SUBLINGUAL

## 2021-04-30 MED ORDER — NITROGLYCERIN 0.4 MG SL SUBL
SUBLINGUAL_TABLET | SUBLINGUAL | Status: AC
  Filled 2021-04-30: qty 2

## 2021-04-30 MED ORDER — IOHEXOL 350 MG/ML SOLN
100.0000 mL | Freq: Once | INTRAVENOUS | Status: AC | PRN
  Administered 2021-04-30: 100 mL via INTRAVENOUS

## 2021-05-01 ENCOUNTER — Telehealth: Payer: Self-pay | Admitting: Cardiology

## 2021-05-01 NOTE — Telephone Encounter (Signed)
Patient's wife called in to schedule the work in appt Dr. Bettina Gavia advised he come in for on Friday per his CT results. Please advise.  ?

## 2021-05-01 NOTE — Telephone Encounter (Signed)
Called the patient's wife and scheduled appointment for the patient on 05/03/21 at 9:00 am. ?

## 2021-05-03 ENCOUNTER — Encounter: Payer: Self-pay | Admitting: Cardiology

## 2021-05-03 ENCOUNTER — Ambulatory Visit: Payer: Medicare HMO | Admitting: Cardiology

## 2021-05-03 VITALS — BP 100/60 | HR 82 | Ht 70.0 in | Wt 172.0 lb

## 2021-05-03 DIAGNOSIS — E782 Mixed hyperlipidemia: Secondary | ICD-10-CM

## 2021-05-03 DIAGNOSIS — I25118 Atherosclerotic heart disease of native coronary artery with other forms of angina pectoris: Secondary | ICD-10-CM | POA: Diagnosis not present

## 2021-05-03 DIAGNOSIS — I4510 Unspecified right bundle-branch block: Secondary | ICD-10-CM

## 2021-05-03 MED ORDER — ASPIRIN EC 81 MG PO TBEC: 81.0000 mg | DELAYED_RELEASE_TABLET | Freq: Every day | ORAL | 3 refills | Status: DC

## 2021-05-03 MED ORDER — NITROGLYCERIN 0.4 MG SL SUBL: 0.4000 mg | SUBLINGUAL_TABLET | SUBLINGUAL | 1 refills | Status: DC | PRN

## 2021-05-03 MED ORDER — METOPROLOL SUCCINATE ER 25 MG PO TB24: 12.5000 mg | ORAL_TABLET | Freq: Every day | ORAL | 3 refills | Status: DC

## 2021-05-03 NOTE — Patient Instructions (Signed)
Medication Instructions:  ?Your physician has recommended you make the following change in your medication:  ? ?START: Aspirin 81 mg daily ?START: Toprol xl 12.5 mg daily ?START: Nitroglycerin 0.4 mg under the tongue every 5 minutes as needed for chest pain ? ?*If you need a refill on your cardiac medications before your next appointment, please call your pharmacy* ? ? ?Lab Work: ?Labs today: BMP, CBC ? ?If you have labs (blood work) drawn today and your tests are completely normal, you will receive your results only by: ?MyChart Message (if you have MyChart) OR ?A paper copy in the mail ?If you have any lab test that is abnormal or we need to change your treatment, we will call you to review the results. ? ? ?Testing/Procedures: ? ?[facility] ?[address] ?[place] [address] ?Dept: [phone] ?Loc: [phone] ? ?Shaquille Skarda  05/03/2021 ? ?You are scheduled for a Cardiac Catheterization on Wednesday, May 3 with Dr. Lauree Chandler. ? ?1. Please arrive at the Main Entrance A at [facility]: [place], [place] [address] at 11:00 AM (This time is two hours before your procedure to ensure your preparation). Free valet parking service is available.  ? ?Special note: Every effort is made to have your procedure done on time. Please understand that emergencies sometimes delay scheduled procedures. ? ?2. Diet: Do not eat solid foods after midnight.  You may have clear liquids until 5 AM upon the day of the procedure. ? ?3. 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 You do not need to be fasting. ? ?4. Medication instructions in preparation for your procedure: ? ? Contrast Allergy: No ? ?On the morning of your procedure, take Aspirin and any morning medicines NOT listed above.  You may use sips of water. ? ?5. Plan to go home the same day, you will  only stay overnight if medically necessary. ?6. You MUST have a responsible adult to drive you home. ?7. An adult MUST be with you the first 24 hours after you arrive home. ?8. Bring a current list of your medications, and the last time and date medication taken. ?9. Bring ID and current insurance cards. ?10.Please wear clothes that are easy to get on and off and wear slip-on shoes. ? ?Thank you for allowing [place] to care for you! ?  -- [facility] ? ? ? ?Follow-Up: ?At [facility], you and your health needs are our priority.  As part of our continuing mission to provide you with exceptional heart care, we have created designated Provider Care Teams.  These Care Teams include your primary Cardiologist (physician) and Advanced Practice Providers (APPs -  Physician Assistants and Nurse Practitioners) who all work together to provide you with the care you need, when you need it. ? ?We recommend signing up for the patient portal called "MyChart".  Sign up information is provided on this After Visit Summary.  MyChart is used to connect with patients for Virtual Visits (Telemedicine).  Patients are able to view lab/test results, encounter notes, upcoming appointments, etc.  Non-urgent messages can be sent to your provider as well.   ?To learn more about what you can do with MyChart, go to [email or web address].   ? ?Your next appointment:   ?3 month(s) ? ?The format for your next appointment:   ?In Person ? ?Provider:   ?  Shirlee More, MD  ? ? ?Other Instructions ?None ? ?Important Information About Sugar ? ? ? ? ? ? ?

## 2021-05-03 NOTE — Progress Notes (Signed)
?Cardiology Office Note:   ? ?Date:  05/03/2021  ? ?ID:  Derek Wood, DOB 07/08/1945, MRN [id number] ? ?PCP:  Street, Sharon Mt, MD  ?Cardiologist:  Shirlee More, MD   ? ?Referring MD: Street, Sharon Mt, *  ? ? ?ASSESSMENT:   ? ?1. Coronary artery disease of native artery of native heart with stable angina pectoris ([place])   ?2. Right bundle branch block   ?3. Mixed hyperlipidemia   ? ?PLAN:   ? ?In order of problems listed above: ? ?Cardiac CTA is highly abnormal severe three-vessel coronary disease and nondiabetic he will undergo coronary angiography and then decision regarding revascularization.  I will optimize treatment initiate aspirin low-dose beta-blocker and continue his recently introduced high intensity statin ? ? ?Next appointment: 8 weeks ? ? ?Medication Adjustments/Labs and Tests Ordered: ?Current medicines are reviewed at length with the patient today.  Concerns regarding medicines are outlined above.  ?No orders of the defined types were placed in this encounter. ? ?No orders of the defined types were placed in this encounter. ? ? ?Chief Complaint  ?Patient presents with  ? Follow-up  ? Coronary Artery Disease  ?  h  ? ? ?History of Present Illness:   ? ?Derek Wood is a 76 y.o. male with a hx of torsional chest pain right bundle branch block and hyperlipidemia previously intolerant of statin.04/15/2021 last seen . ? ?Compliance with diet, lifestyle and medications: Yes ? ?His wife is present reviewed the results of his cardiac CTA is highly abnormal with severe three-vessel coronary disease ?He has a stable pattern of exertional angina his son is in the hospital with what sounds like hemorrhagic pancreatitis with multiorgan failure and when he walks in from the garage he has exertional chest pain ?He is not having nocturnal and rest symptoms ?We will start a low-dose beta-blocker coated aspirin continue with statin ?I advised him undergo diagnostic coronary angiography risk benefits and  options detailed and told him and get the information to guide revascularization.  He is nondiabetic. ?He has known dye allergy and is normal renal function ? ?He had a high risk quite abnormal cardiac CTA report 04/30/2021 coronary calcium score could not be calculated but appeared to be severely elevated and he had severe multivessel CAD present.  Left main coronary artery was free of stenosis.  Left anterior descending coronary artery diffuse calcific plaque and severe proximal stenosis FFR 0.57.  Left circumflex coronary artery supplied a large first marginal branch and had severe stenosis in the mid vessel FFR of 0.67.  Right coronary artery is a large dominant diffuse calcific plaque with high-grade proximal severe stenosis FFR 0.63.  In summary he had severe three-vessel coronary artery disease.  CT over read of the chest was normal. ? ?His recent lipid profile showed severely elevated LDL of 183. ?Past Medical History:  ?Diagnosis Date  ? GERD (gastroesophageal reflux disease)   ? Mixed hyperlipidemia   ? ? ?Past Surgical History:  ?Procedure Laterality Date  ? COLONOSCOPY  08/2016  ? CYSTECTOMY  04/2021  ? Upper right quadrant of chest  ? ROTATOR CUFF REPAIR Right 02/2015  ? ? ?Current Medications: ?Current Meds  ?Medication Sig  ? Multiple Vitamin (ONE-DAILY MULTI-VITAMIN PO) Take 1 tablet by mouth daily at 12 noon.  ? omeprazole (PRILOSEC) 20 MG capsule Take 20 mg by mouth daily.  ? rosuvastatin (CRESTOR) 20 MG tablet Take 1 tablet (20 mg total) by mouth daily.  ?  ? ?Allergies:   Oxycodone and  Statins  ? ?Social History  ? ?Socioeconomic History  ? Marital status: Married  ?  Spouse name: Not on file  ? Number of children: Not on file  ? Years of education: Not on file  ? Highest education level: Not on file  ?Occupational History  ? Not on file  ?Tobacco Use  ? Smoking status: Never  ?  Passive exposure: Never  ? Smokeless tobacco: Never  ?Substance and Sexual Activity  ? Alcohol use: Not Currently  ?  Drug use: Never  ? Sexual activity: Not on file  ?Other Topics Concern  ? Not on file  ?Social History Narrative  ? Not on file  ? ?Social Determinants of Health  ? ?Financial Resource Strain: Not on file  ?Food Insecurity: Not on file  ?Transportation Needs: Not on file  ?Physical Activity: Not on file  ?Stress: Not on file  ?Social Connections: Not on file  ?  ? ?Family History: ?The patient's family history includes Cancer in his mother; Diabetes in his brother; Heart attack in his father; Heart disease in his brother. ?ROS:   ?Please see the history of present illness.    ?All other systems reviewed and are negative. ? ?EKGs/Labs/Other Studies Reviewed:   ? ?The following studies were reviewed today: ? ?EKG:  EKG ordered today and personally reviewed.  The ekg ordered today demonstrates sinus rhythm right bundle branch block incomplete with repolarization changes ? ?Recent Labs: ?04/15/2021: ALT 18 ?04/23/2021: BUN 14; Creatinine, Ser 0.90; Potassium 4.3; Sodium 141  ?Recent Lipid Panel ?   ?Component Value Date/Time  ? CHOL 253 (H) 04/15/2021 1029  ? TRIG 130 04/15/2021 1029  ? HDL 46 04/15/2021 1029  ? CHOLHDL 5.5 (H) 04/15/2021 1029  ? LDLCALC 183 (H) 04/15/2021 1029  ? ? ?Physical Exam:   ? ?VS:  BP 100/60 (BP Location: Right Arm, Patient Position: Sitting, Cuff Size: Normal)   Pulse 82   Ht '5\' 10"'$  (1.778 m)   Wt 172 lb (78 kg)   SpO2 97%   BMI 24.68 kg/m?    ? ?Wt Readings from Last 3 Encounters:  ?05/03/21 172 lb (78 kg)  ?04/15/21 168 lb (76.2 kg)  ?  ? ?GEN:  Well nourished, well developed in no acute distress ?HEENT: Normal ?NECK: No JVD; No carotid bruits ?LYMPHATICS: No lymphadenopathy ?CARDIAC: RRR, no murmurs, rubs, gallops ?RESPIRATORY:  Clear to auscultation without rales, wheezing or rhonchi  ?ABDOMEN: Soft, non-tender, non-distended ?MUSCULOSKELETAL:  No edema; No deformity  ?SKIN: Warm and dry ?NEUROLOGIC:  Alert and oriented x 3 ?PSYCHIATRIC:  Normal affect  ? ? ?Signed, ?Shirlee More, MD   ?05/03/2021 9:22 AM    ?[place]  ?

## 2021-05-03 NOTE — Addendum Note (Signed)
Addended by: Edwyna Shell I on: 05/03/2021 10:07 AM ? ? Modules accepted: Orders ? ?

## 2021-05-03 NOTE — H&P (View-Only) (Signed)
?Cardiology Office Note:   ? ?Date:  05/03/2021  ? ?ID:  Derek Wood, DOB July 05, 1945, MRN [id number] ? ?PCP:  Street, Sharon Mt, MD  ?Cardiologist:  Shirlee More, MD   ? ?Referring MD: Street, Sharon Mt, *  ? ? ?ASSESSMENT:   ? ?1. Coronary artery disease of native artery of native heart with stable angina pectoris ([place])   ?2. Right bundle branch block   ?3. Mixed hyperlipidemia   ? ?PLAN:   ? ?In order of problems listed above: ? ?Cardiac CTA is highly abnormal severe three-vessel coronary disease and nondiabetic he will undergo coronary angiography and then decision regarding revascularization.  I will optimize treatment initiate aspirin low-dose beta-blocker and continue his recently introduced high intensity statin ? ? ?Next appointment: 8 weeks ? ? ?Medication Adjustments/Labs and Tests Ordered: ?Current medicines are reviewed at length with the patient today.  Concerns regarding medicines are outlined above.  ?No orders of the defined types were placed in this encounter. ? ?No orders of the defined types were placed in this encounter. ? ? ?Chief Complaint  ?Patient presents with  ? Follow-up  ? Coronary Artery Disease  ?  h  ? ? ?History of Present Illness:   ? ?Derek Wood is a 76 y.o. male with a hx of torsional chest pain right bundle branch block and hyperlipidemia previously intolerant of statin.04/15/2021 last seen . ? ?Compliance with diet, lifestyle and medications: Yes ? ?His wife is present reviewed the results of his cardiac CTA is highly abnormal with severe three-vessel coronary disease ?He has a stable pattern of exertional angina his son is in the hospital with what sounds like hemorrhagic pancreatitis with multiorgan failure and when he walks in from the garage he has exertional chest pain ?He is not having nocturnal and rest symptoms ?We will start a low-dose beta-blocker coated aspirin continue with statin ?I advised him undergo diagnostic coronary angiography risk benefits and  options detailed and told him and get the information to guide revascularization.  He is nondiabetic. ?He has known dye allergy and is normal renal function ? ?He had a high risk quite abnormal cardiac CTA report 04/30/2021 coronary calcium score could not be calculated but appeared to be severely elevated and he had severe multivessel CAD present.  Left main coronary artery was free of stenosis.  Left anterior descending coronary artery diffuse calcific plaque and severe proximal stenosis FFR 0.57.  Left circumflex coronary artery supplied a large first marginal branch and had severe stenosis in the mid vessel FFR of 0.67.  Right coronary artery is a large dominant diffuse calcific plaque with high-grade proximal severe stenosis FFR 0.63.  In summary he had severe three-vessel coronary artery disease.  CT over read of the chest was normal. ? ?His recent lipid profile showed severely elevated LDL of 183. ?Past Medical History:  ?Diagnosis Date  ? GERD (gastroesophageal reflux disease)   ? Mixed hyperlipidemia   ? ? ?Past Surgical History:  ?Procedure Laterality Date  ? COLONOSCOPY  08/2016  ? CYSTECTOMY  04/2021  ? Upper right quadrant of chest  ? ROTATOR CUFF REPAIR Right 02/2015  ? ? ?Current Medications: ?Current Meds  ?Medication Sig  ? Multiple Vitamin (ONE-DAILY MULTI-VITAMIN PO) Take 1 tablet by mouth daily at 12 noon.  ? omeprazole (PRILOSEC) 20 MG capsule Take 20 mg by mouth daily.  ? rosuvastatin (CRESTOR) 20 MG tablet Take 1 tablet (20 mg total) by mouth daily.  ?  ? ?Allergies:   Oxycodone and  Statins  ? ?Social History  ? ?Socioeconomic History  ? Marital status: Married  ?  Spouse name: Not on file  ? Number of children: Not on file  ? Years of education: Not on file  ? Highest education level: Not on file  ?Occupational History  ? Not on file  ?Tobacco Use  ? Smoking status: Never  ?  Passive exposure: Never  ? Smokeless tobacco: Never  ?Substance and Sexual Activity  ? Alcohol use: Not Currently  ?  Drug use: Never  ? Sexual activity: Not on file  ?Other Topics Concern  ? Not on file  ?Social History Narrative  ? Not on file  ? ?Social Determinants of Health  ? ?Financial Resource Strain: Not on file  ?Food Insecurity: Not on file  ?Transportation Needs: Not on file  ?Physical Activity: Not on file  ?Stress: Not on file  ?Social Connections: Not on file  ?  ? ?Family History: ?The patient's family history includes Cancer in his mother; Diabetes in his brother; Heart attack in his father; Heart disease in his brother. ?ROS:   ?Please see the history of present illness.    ?All other systems reviewed and are negative. ? ?EKGs/Labs/Other Studies Reviewed:   ? ?The following studies were reviewed today: ? ?EKG:  EKG ordered today and personally reviewed.  The ekg ordered today demonstrates sinus rhythm right bundle branch block incomplete with repolarization changes ? ?Recent Labs: ?04/15/2021: ALT 18 ?04/23/2021: BUN 14; Creatinine, Ser 0.90; Potassium 4.3; Sodium 141  ?Recent Lipid Panel ?   ?Component Value Date/Time  ? CHOL 253 (H) 04/15/2021 1029  ? TRIG 130 04/15/2021 1029  ? HDL 46 04/15/2021 1029  ? CHOLHDL 5.5 (H) 04/15/2021 1029  ? LDLCALC 183 (H) 04/15/2021 1029  ? ? ?Physical Exam:   ? ?VS:  BP 100/60 (BP Location: Right Arm, Patient Position: Sitting, Cuff Size: Normal)   Pulse 82   Ht '5\' 10"'$  (1.778 m)   Wt 172 lb (78 kg)   SpO2 97%   BMI 24.68 kg/m?    ? ?Wt Readings from Last 3 Encounters:  ?05/03/21 172 lb (78 kg)  ?04/15/21 168 lb (76.2 kg)  ?  ? ?GEN:  Well nourished, well developed in no acute distress ?HEENT: Normal ?NECK: No JVD; No carotid bruits ?LYMPHATICS: No lymphadenopathy ?CARDIAC: RRR, no murmurs, rubs, gallops ?RESPIRATORY:  Clear to auscultation without rales, wheezing or rhonchi  ?ABDOMEN: Soft, non-tender, non-distended ?MUSCULOSKELETAL:  No edema; No deformity  ?SKIN: Warm and dry ?NEUROLOGIC:  Alert and oriented x 3 ?PSYCHIATRIC:  Normal affect  ? ? ?Signed, ?Shirlee More, MD   ?05/03/2021 9:22 AM    ?[place]  ?

## 2021-05-03 NOTE — Addendum Note (Signed)
Addended byShirlee More on: 05/03/2021 12:06 PM ? ? Modules accepted: Orders ? ?

## 2021-05-04 LAB — BASIC METABOLIC PANEL
BUN/Creatinine Ratio: 22 (ref 10–24)
BUN: 17 mg/dL (ref 8–27)
CO2: 21 mmol/L (ref 20–29)
Calcium: 9.2 mg/dL (ref 8.6–10.2)
Chloride: 104 mmol/L (ref 96–106)
Creatinine, Ser: 0.79 mg/dL (ref 0.76–1.27)
Glucose: 106 mg/dL — ABNORMAL HIGH (ref 70–99)
Potassium: 4.5 mmol/L (ref 3.5–5.2)
Sodium: 139 mmol/L (ref 134–144)
eGFR: 91 mL/min/{1.73_m2} (ref >59)

## 2021-05-04 LAB — CBC
Hematocrit: 43.7 % (ref 37.5–51.0)
Hemoglobin: 15.7 g/dL (ref 13.0–17.7)
MCH: 30.5 pg (ref 26.6–33.0)
MCHC: 35.9 g/dL — ABNORMAL HIGH (ref 31.5–35.7)
MCV: 85 fL (ref 79–97)
Platelets: 149 10*3/uL — ABNORMAL LOW (ref 150–450)
RBC: 5.14 x10E6/uL (ref 4.14–5.80)
RDW: 11.9 % (ref 11.6–15.4)
WBC: 7.1 10*3/uL (ref 3.4–10.8)

## 2021-05-07 ENCOUNTER — Telehealth: Payer: Self-pay | Admitting: *Deleted

## 2021-05-07 NOTE — Telephone Encounter (Signed)
Cardiac Catheterization scheduled at [facility] for: Wednesday May 08, 2021 1 PM ?Arrival time and place: [facility] Main Entrance A at: 73 AM ? ? ?No solid food after midnight prior to cath, clear liquids until 5 AM day of procedure. ? ?Medication instructions: ?-Usual morning medications can be taken with sips of water including aspirin 81 mg. ? ?Confirmed patient has responsible adult to drive home post procedure and be with patient first 24 hours after arriving home. ? ?Patient reports no new symptoms concerning for COVID-19/no exposure to COVID-19 in the past 10 days. ? ?Left message to call back to review procedure instructions with patient. ?

## 2021-05-07 NOTE — Telephone Encounter (Signed)
No answer, voicemail message. 

## 2021-05-07 NOTE — Telephone Encounter (Signed)
Patient asked me to call his wife to review instructions, call placed to wife, left voicemail message to call back to review procedure instructions.  ?

## 2021-05-07 NOTE — Telephone Encounter (Signed)
Reviewed procedure instructions with patient's wife. ?

## 2021-05-08 ENCOUNTER — Ambulatory Visit (HOSPITAL_COMMUNITY)
Admission: RE | Admit: 2021-05-08 | Discharge: 2021-05-08 | Disposition: A | Discharge status: Home / Self Care | Payer: Medicare HMO | Attending: Cardiovascular Disease | Admitting: Cardiovascular Disease

## 2021-05-08 ENCOUNTER — Ambulatory Visit (HOSPITAL_BASED_OUTPATIENT_CLINIC_OR_DEPARTMENT_OTHER): Payer: Medicare HMO

## 2021-05-08 ENCOUNTER — Other Ambulatory Visit: Payer: Self-pay

## 2021-05-08 ENCOUNTER — Encounter (HOSPITAL_COMMUNITY)
Admission: RE | Disposition: A | Discharge status: Home / Self Care | Payer: Self-pay | Source: Home / Self Care | Attending: Cardiovascular Disease

## 2021-05-08 DIAGNOSIS — I4510 Unspecified right bundle-branch block: Secondary | ICD-10-CM | POA: Diagnosis not present

## 2021-05-08 DIAGNOSIS — I340 Nonrheumatic mitral (valve) insufficiency: Secondary | ICD-10-CM | POA: Diagnosis not present

## 2021-05-08 DIAGNOSIS — E782 Mixed hyperlipidemia: Secondary | ICD-10-CM | POA: Insufficient documentation

## 2021-05-08 DIAGNOSIS — I25118 Atherosclerotic heart disease of native coronary artery with other forms of angina pectoris: Secondary | ICD-10-CM | POA: Diagnosis not present

## 2021-05-08 DIAGNOSIS — Z01810 Encounter for preprocedural cardiovascular examination: Secondary | ICD-10-CM | POA: Diagnosis not present

## 2021-05-08 HISTORY — PX: LEFT HEART CATH AND CORONARY ANGIOGRAPHY: CATH118249

## 2021-05-08 LAB — ECHOCARDIOGRAM COMPLETE
AR max vel: 2.63 cm2
AV Peak grad: 4.8 mmHg
Ao pk vel: 1.09 m/s
Area-P 1/2: 4.01 cm2
Calc EF: 61 %
Height: 70 in
P 1/2 time: 506 msec
S' Lateral: 2.8 cm
Single Plane A2C EF: 61.3 %
Single Plane A4C EF: 61.5 %
Weight: 2624 oz

## 2021-05-08 SURGERY — LEFT HEART CATH AND CORONARY ANGIOGRAPHY
Anesthesia: LOCAL

## 2021-05-08 MED ORDER — ACETAMINOPHEN 325 MG PO TABS: 650.0000 mg | ORAL_TABLET | ORAL | Status: DC | PRN

## 2021-05-08 MED ORDER — HEPARIN SODIUM (PORCINE) 1000 UNIT/ML IJ SOLN
INTRAMUSCULAR | Status: AC
  Filled 2021-05-08: qty 10

## 2021-05-08 MED ORDER — MIDAZOLAM HCL 2 MG/2ML IJ SOLN
INTRAMUSCULAR | Status: AC
  Filled 2021-05-08: qty 2

## 2021-05-08 MED ORDER — HEPARIN (PORCINE) IN NACL 1000-0.9 UT/500ML-% IV SOLN
INTRAVENOUS | Status: DC | PRN
  Administered 2021-05-08 (×2): 500 mL

## 2021-05-08 MED ORDER — SODIUM CHLORIDE 0.9 % WEIGHT BASED INFUSION
3.0000 mL/kg/h | INTRAVENOUS | Status: AC
  Administered 2021-05-08: 3 mL/kg/h via INTRAVENOUS

## 2021-05-08 MED ORDER — ASPIRIN 81 MG PO CHEW: 81.0000 mg | CHEWABLE_TABLET | ORAL | Status: DC

## 2021-05-08 MED ORDER — HYDRALAZINE HCL 20 MG/ML IJ SOLN: 10.0000 mg | INTRAMUSCULAR | Status: DC | PRN

## 2021-05-08 MED ORDER — HEPARIN (PORCINE) IN NACL 1000-0.9 UT/500ML-% IV SOLN
INTRAVENOUS | Status: AC
Start: 2021-05-08 — End: ?
  Filled 2021-05-08: qty 1000

## 2021-05-08 MED ORDER — MIDAZOLAM HCL 2 MG/2ML IJ SOLN
INTRAMUSCULAR | Status: DC | PRN
  Administered 2021-05-08: 1 mg via INTRAVENOUS

## 2021-05-08 MED ORDER — SODIUM CHLORIDE 0.9% FLUSH: 3.0000 mL | INTRAVENOUS | Status: DC | PRN

## 2021-05-08 MED ORDER — SODIUM CHLORIDE 0.9 % IV SOLN
250.0000 mL | INTRAVENOUS | Status: DC | PRN
Start: 2021-05-08 — End: 2021-05-08

## 2021-05-08 MED ORDER — FENTANYL CITRATE (PF) 100 MCG/2ML IJ SOLN
INTRAMUSCULAR | Status: DC | PRN
Start: 2021-05-08 — End: 2021-05-08
  Administered 2021-05-08: 25 ug via INTRAVENOUS

## 2021-05-08 MED ORDER — SODIUM CHLORIDE 0.9% FLUSH: 3.0000 mL | Freq: Two times a day (BID) | INTRAVENOUS | Status: DC

## 2021-05-08 MED ORDER — VERAPAMIL HCL 2.5 MG/ML IV SOLN
INTRAVENOUS | Status: AC
  Filled 2021-05-08: qty 2

## 2021-05-08 MED ORDER — ONDANSETRON HCL 4 MG/2ML IJ SOLN: 4.0000 mg | Freq: Four times a day (QID) | INTRAMUSCULAR | Status: DC | PRN

## 2021-05-08 MED ORDER — SODIUM CHLORIDE 0.9 % WEIGHT BASED INFUSION: 1.0000 mL/kg/h | INTRAVENOUS | Status: DC

## 2021-05-08 MED ORDER — SODIUM CHLORIDE 0.9 % IV SOLN: 250.0000 mL | INTRAVENOUS | Status: DC | PRN

## 2021-05-08 MED ORDER — HEPARIN SODIUM (PORCINE) 1000 UNIT/ML IJ SOLN
INTRAMUSCULAR | Status: DC | PRN
  Administered 2021-05-08: 4000 [IU] via INTRAVENOUS

## 2021-05-08 MED ORDER — VERAPAMIL HCL 2.5 MG/ML IV SOLN
INTRAVENOUS | Status: DC | PRN
  Administered 2021-05-08: 10 mL via INTRA_ARTERIAL

## 2021-05-08 MED ORDER — IOHEXOL 350 MG/ML SOLN
INTRAVENOUS | Status: DC | PRN
  Administered 2021-05-08: 75 mL

## 2021-05-08 MED ORDER — FENTANYL CITRATE (PF) 100 MCG/2ML IJ SOLN
INTRAMUSCULAR | Status: AC
Start: 2021-05-08 — End: ?
  Filled 2021-05-08: qty 2

## 2021-05-08 MED ORDER — SODIUM CHLORIDE 0.9 % IV SOLN: INTRAVENOUS | Status: AC

## 2021-05-08 MED ORDER — LABETALOL HCL 5 MG/ML IV SOLN: 10.0000 mg | INTRAVENOUS | Status: DC | PRN

## 2021-05-08 MED ORDER — LIDOCAINE HCL (PF) 1 % IJ SOLN
INTRAMUSCULAR | Status: AC
  Filled 2021-05-08: qty 30

## 2021-05-08 MED ORDER — LIDOCAINE HCL (PF) 1 % IJ SOLN
INTRAMUSCULAR | Status: DC | PRN
Start: 2021-05-08 — End: 2021-05-08
  Administered 2021-05-08: 2 mL

## 2021-05-08 SURGICAL SUPPLY — 10 items
CATH 5FR JL3.5 JR4 ANG PIG MP (CATHETERS) ×1 IMPLANT
DEVICE RAD COMP TR BAND LRG (VASCULAR PRODUCTS) ×1 IMPLANT
ELECT DEFIB PAD ADLT CADENCE (PAD) ×1 IMPLANT
GLIDESHEATH SLEND SS 6F .021 (SHEATH) ×1 IMPLANT
GUIDEWIRE INQWIRE 1.5J.035X260 (WIRE) IMPLANT
INQWIRE 1.5J .035X260CM (WIRE) ×2
KIT HEART LEFT (KITS) ×2 IMPLANT
PACK CARDIAC CATHETERIZATION (CUSTOM PROCEDURE TRAY) ×2 IMPLANT
TRANSDUCER W/STOPCOCK (MISCELLANEOUS) ×2 IMPLANT
TUBING CIL FLEX 10 FLL-RA (TUBING) ×2 IMPLANT

## 2021-05-08 NOTE — Discharge Instructions (Signed)
Radial Site Care  This sheet gives you information about how to care for yourself after your procedure. Your health care provider may also give you more specific instructions. If you have problems or questions, contact your health care provider. What can I expect after the procedure? After the procedure, it is common to have: Bruising and tenderness at the catheter insertion area. Follow these instructions at home: Medicines Take over-the-counter and prescription medicines only as told by your health care provider. Insertion site care Follow instructions from your health care provider about how to take care of your insertion site. Make sure you: Wash your hands with soap and water before you remove your bandage (dressing). If soap and water are not available, use hand sanitizer. May remove dressing in 24 hours. Check your insertion site every day for signs of infection. Check for: Redness, swelling, or pain. Fluid or blood. Pus or a bad smell. Warmth. Do no take baths, swim, or use a hot tub for 5 days. You may shower 24-48 hours after the procedure. Remove the dressing and gently wash the site with plain soap and water. Pat the area dry with a clean towel. Do not rub the site. That could cause bleeding. Do not apply powder or lotion to the site. Activity  For 24 hours after the procedure, or as directed by your health care provider: Do not flex or bend the affected arm. Do not push or pull heavy objects with the affected arm. Do not drive yourself home from the hospital or clinic. You may drive 24 hours after the procedure. Do not operate machinery or power tools. KEEP ARM ELEVATED THE REMAINDER OF THE DAY. Do not push, pull or lift anything that is heavier than 10 lb for 5 days. Ask your health care provider when it is okay to: Return to work or school. Resume usual physical activities or sports. Resume sexual activity. General instructions If the catheter site starts to  bleed, raise your arm and put firm pressure on the site. If the bleeding does not stop, get help right away. This is a medical emergency. DRINK PLENTY OF FLUIDS FOR THE NEXT 2-3 DAYS. No alcohol consumption for 24 hours after receiving sedation. If you went home on the same day as your procedure, a responsible adult should be with you for the first 24 hours after you arrive home. Keep all follow-up visits as told by your health care provider. This is important. Contact a health care provider if: You have a fever. You have redness, swelling, or yellow drainage around your insertion site. Get help right away if: You have unusual pain at the radial site. The catheter insertion area swells very fast. The insertion area is bleeding, and the bleeding does not stop when you hold steady pressure on the area. Your arm or hand becomes pale, cool, tingly, or numb. These symptoms may represent a serious problem that is an emergency. Do not wait to see if the symptoms will go away. Get medical help right away. Call your local emergency services (911 in the [place] Do not drive yourself to the hospital. Summary After the procedure, it is common to have bruising and tenderness at the site. Follow instructions from your health care provider about how to take care of your radial site wound. Check the wound every day for signs of infection.  This information is not intended to replace advice given to you by your health care provider. Make sure you discuss any questions you have with   your health care provider. Document Revised: 01/28/2017 Document Reviewed: 01/28/2017 Elsevier Patient Education  2020 [employer] Inc[employer]  

## 2021-05-08 NOTE — Progress Notes (Signed)
Pt ambulated without difficulty or bleeding.   Discharged home with wife who will drive and stay with pt x 24 hrs 

## 2021-05-08 NOTE — Interval H&P Note (Signed)
History and Physical Interval Note: ? ?05/08/2021 ?11:20 AM ? ?Braylin Xu  has presented today for surgery, with the diagnosis of CAD.  The various methods of treatment have been discussed with the patient and family. After consideration of risks, benefits and other options for treatment, the patient has consented to  Procedure(s): ?LEFT HEART CATH AND CORONARY ANGIOGRAPHY (N/A) as a surgical intervention.  The patient's history has been reviewed, patient examined, no change in status, stable for surgery.  I have reviewed the patient's chart and labs.  Questions were answered to the patient's satisfaction.   ? ?Cath Lab Visit (complete for each Cath Lab visit) ? ?Clinical Evaluation Leading to the Procedure:  ? ?ACS: No. ? ?Non-ACS:   ? ?Anginal Classification: CCS III ? ?Anti-ischemic medical therapy: Minimal Therapy (1 class of medications) ? ?Non-Invasive Test Results: High-risk stress test findings: cardiac mortality >3%/year (Coronary CTA with severe three vessel CAD) ? ?Prior CABG: Previous CABG ? ? ? ? ? ? ? ?Lauree Chandler ? ? ?

## 2021-05-09 ENCOUNTER — Encounter (HOSPITAL_COMMUNITY): Payer: Self-pay | Admitting: Cardiovascular Disease

## 2021-05-10 ENCOUNTER — Institutional Professional Consult (permissible substitution): Payer: Medicare HMO | Admitting: Thoracic Surgery (Cardiothoracic Vascular Surgery)

## 2021-05-10 ENCOUNTER — Other Ambulatory Visit: Payer: Self-pay | Admitting: *Deleted

## 2021-05-10 VITALS — BP 180/72 | HR 76 | Resp 20 | Ht 70.0 in | Wt 164.0 lb

## 2021-05-10 DIAGNOSIS — I2510 Atherosclerotic heart disease of native coronary artery without angina pectoris: Secondary | ICD-10-CM | POA: Diagnosis not present

## 2021-05-10 NOTE — Progress Notes (Signed)
? ?   ?[address] ?      [place] [address] ?            [phone]       ? ?Kilo Baus ?[facility] Record #[id number] ?Date of Birth: Jul 05, 1945 ? ?Referring: Burnell Blanks* ?Primary Care: Street, Sharon Mt, MD ?Primary Cardiologist:None ? ?Chief Complaint:    ?Chief Complaint  ?Patient presents with  ? Coronary Artery Disease  ?  Surgical consult, Cardiac Cath and ECHO 05/08/21  ? ? ?History of Present Illness:     ?76 year old male presents for surgical evaluation of the coronary artery disease.  He does have a strong family history of coronary disease and that his brother and father both died from heart attacks.  He denies any anginal symptoms, but on deeper probing does admit to some exertional shortness of breath.  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 ?Past Medical and Surgical History: ?Previous Chest Surgery: No ?Previous Chest Radiation: No ?Diabetes Mellitus: No.  HbA1C pending ?Creatinine: 0.7 ? ?Past Medical History:  ?Diagnosis Date  ? GERD (gastroesophageal reflux disease)   ? Mixed hyperlipidemia   ? ? ?Past Surgical History:  ?Procedure Laterality Date  ? COLONOSCOPY  08/2016  ? CYSTECTOMY  04/2021  ? Upper right quadrant of chest  ? LEFT HEART CATH AND CORONARY ANGIOGRAPHY N/A 05/08/2021  ? Procedure: LEFT HEART CATH AND CORONARY ANGIOGRAPHY;  Surgeon: Burnell Blanks, MD;  Location: [facility];  Service: Cardiovascular;  Laterality: N/A;  ? ROTATOR CUFF REPAIR Right 02/2015  ? ? ?Social History: ?Support: Lives with his wife ? ?Social History  ? ?Tobacco Use  ?Smoking Status Never  ? Passive exposure: Never  ?Smokeless Tobacco Never  ?  ?Social History  ? ?Substance and Sexual Activity  ?Alcohol Use Not Currently  ? ? ? ?Allergies  ?Allergen Reactions  ? Oxycodone   ?  Other reaction(s): Other (See Comments) ?"made me feel funny"  ? Statins Other (See Comments)  ?  Myalgia   ? ? ? ?Current Outpatient Medications  ?Medication Sig Dispense Refill  ? aspirin EC 81 MG tablet Take 1 tablet (81 mg total) by mouth daily. Swallow whole. 90 tablet 3  ? Coenzyme Q10 100 MG capsule Take 100 mg by mouth daily.    ? metoprolol succinate (TOPROL XL) 25 MG 24 hr tablet Take 0.5 tablets (12.5 mg total) by mouth daily. 45 tablet 3  ? Multiple Vitamin (ONE-DAILY MULTI-VITAMIN PO) Take 1 tablet by mouth daily.    ? naproxen sodium (ALEVE) 220 MG tablet Take 440 mg by mouth daily as needed (pain).    ? nitroGLYCERIN (NITROSTAT) 0.4 MG SL tablet Place 1 tablet (0.4 mg total) under the tongue every 5 (five) minutes as needed for chest pain. 30 tablet 1  ? omeprazole (PRILOSEC) 20 MG capsule Take 20 mg by mouth daily.    ? rosuvastatin (CRESTOR) 20 MG tablet Take 1 tablet (20 mg total) by mouth daily. 90 tablet 3  ? ?No current facility-administered medications for this visit.  ? ? ?(Not in a hospital admission) ? ? ?Family History  ?Problem Relation Age of Onset  ? Cancer Mother   ? Heart attack Father   ? Heart disease Brother   ? Diabetes Brother   ? ? ? ?Review of Systems:  ? ?Review of Systems  ?Constitutional:  Negative for malaise/fatigue and  weight loss.  ?Respiratory:  Positive for shortness of breath.   ?Cardiovascular:  Negative for chest pain and leg swelling.  ?Musculoskeletal: Negative.   ?  ? ?Physical Exam: ?BP (!) 180/72   Pulse 76   Resp 20   Ht '5\' 10"'$  (1.778 m)   Wt 164 lb (74.4 kg)   SpO2 98% Comment: RA  BMI 23.53 kg/m?  ?Physical Exam ?Constitutional:   ?   General: He is not in acute distress. ?   Appearance: Normal appearance. He is normal weight.  ?HENT:  ?   Head: Normocephalic and atraumatic.  ?Cardiovascular:  ?   Rate and Rhythm: Normal rate.  ?Pulmonary:  ?   Effort: Pulmonary effort is normal. No respiratory distress.  ?Abdominal:  ?   General: Abdomen is flat. There is no distension.  ?Musculoskeletal:  ?   Cervical back: Normal range of motion.  ?Skin: ?   General: Skin  is warm and dry.  ?Neurological:  ?   General: No focal deficit present.  ?   Mental Status: He is alert and oriented to person, place, and time.  ?  ? ? ?Diagnostic Studies & Laboratory data: ?   ?Left Heart Catherization: ?Intervention ? ?Echo: ? 1. Left ventricular ejection fraction, by estimation, is 60 to 65%. The  ?left ventricle has normal function. The left ventricle has no regional  ?wall motion abnormalities. Left ventricular diastolic parameters are  ?consistent with Grade I diastolic  ?dysfunction (impaired relaxation).  ? 2. Right ventricular systolic function is normal. The right ventricular  ?size is normal.  ? 3. The mitral valve is normal in structure. Trivial mitral valve  ?regurgitation. No evidence of mitral stenosis.  ? 4. The aortic valve is normal in structure. Aortic valve regurgitation is  ?mild to moderate. No aortic stenosis is present.  ? 5. The inferior vena cava is normal in size with greater than 50%  ?respiratory variability, suggesting right atrial pressure of 3 mmHg. ? ?EKG: ?Sinus RBBB ?I have independently reviewed the above radiologic studies and discussed with the patient  ? ?Recent Lab Findings: ?Lab Results  ?Component Value Date  ? WBC 7.1 05/03/2021  ? HGB 15.7 05/03/2021  ? HCT 43.7 05/03/2021  ? PLT 149 (L) 05/03/2021  ? GLUCOSE 106 (H) 05/03/2021  ? CHOL 253 (H) 04/15/2021  ? TRIG 130 04/15/2021  ? HDL 46 04/15/2021  ? LDLCALC 183 (H) 04/15/2021  ? ALT 18 04/15/2021  ? AST 24 04/15/2021  ? NA 139 05/03/2021  ? K 4.5 05/03/2021  ? CL 104 05/03/2021  ? CREATININE 0.79 05/03/2021  ? BUN 17 05/03/2021  ? CO2 21 05/03/2021  ? ? ? ? ?Assessment / Plan:   ?76 year old male presents with three-vessel coronary artery disease, and mild to moderate aortic valve regurgitation.  Minimally symptomatic however review of his left heart catheterization he does have significant disease including a 70% left main stenosis.  He would like to proceed with surgical revascularization.  The risks  and benefits and details of the procedure were discussed.  In regards to his aortic valve it is more on the mild side on my read.  Given his age, I do not have any plans to replace the valve at this point.  He will be scheduled for CABG x4 on may 10th 2023. ? ? ? ? ?I  spent 40 minutes counseling the patient face to face. ? ? ?[name] Elvis Boot ?05/10/2021 11:19 AM ? ? ? ? ? ? ?

## 2021-05-10 NOTE — H&P (View-Only) (Signed)
? ?   ?[address] ?      [place] [address] ?            [phone]       ? ?Derek Wood ?[facility] Record #[id number] ?Date of Birth: 1945-09-26 ? ?Referring: Burnell Blanks* ?Primary Care: Street, Sharon Mt, MD ?Primary Cardiologist:None ? ?Chief Complaint:    ?Chief Complaint  ?Patient presents with  ? Coronary Artery Disease  ?  Surgical consult, Cardiac Cath and ECHO 05/08/21  ? ? ?History of Present Illness:     ?76 year old male presents for surgical evaluation of the coronary artery disease.  He does have a strong family history of coronary disease and that his brother and father both died from heart attacks.  He denies any anginal symptoms, but on deeper probing does admit to some exertional shortness of breath.  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 ?Past Medical and Surgical History: ?Previous Chest Surgery: No ?Previous Chest Radiation: No ?Diabetes Mellitus: No.  HbA1C pending ?Creatinine: 0.7 ? ?Past Medical History:  ?Diagnosis Date  ? GERD (gastroesophageal reflux disease)   ? Mixed hyperlipidemia   ? ? ?Past Surgical History:  ?Procedure Laterality Date  ? COLONOSCOPY  08/2016  ? CYSTECTOMY  04/2021  ? Upper right quadrant of chest  ? LEFT HEART CATH AND CORONARY ANGIOGRAPHY N/A 05/08/2021  ? Procedure: LEFT HEART CATH AND CORONARY ANGIOGRAPHY;  Surgeon: Burnell Blanks, MD;  Location: [facility];  Service: Cardiovascular;  Laterality: N/A;  ? ROTATOR CUFF REPAIR Right 02/2015  ? ? ?Social History: ?Support: Lives with his wife ? ?Social History  ? ?Tobacco Use  ?Smoking Status Never  ? Passive exposure: Never  ?Smokeless Tobacco Never  ?  ?Social History  ? ?Substance and Sexual Activity  ?Alcohol Use Not Currently  ? ? ? ?Allergies  ?Allergen Reactions  ? Oxycodone   ?  Other reaction(s): Other (See Comments) ?"made me feel funny"  ? Statins Other (See Comments)  ?  Myalgia   ? ? ? ?Current Outpatient Medications  ?Medication Sig Dispense Refill  ? aspirin EC 81 MG tablet Take 1 tablet (81 mg total) by mouth daily. Swallow whole. 90 tablet 3  ? Coenzyme Q10 100 MG capsule Take 100 mg by mouth daily.    ? metoprolol succinate (TOPROL XL) 25 MG 24 hr tablet Take 0.5 tablets (12.5 mg total) by mouth daily. 45 tablet 3  ? Multiple Vitamin (ONE-DAILY MULTI-VITAMIN PO) Take 1 tablet by mouth daily.    ? naproxen sodium (ALEVE) 220 MG tablet Take 440 mg by mouth daily as needed (pain).    ? nitroGLYCERIN (NITROSTAT) 0.4 MG SL tablet Place 1 tablet (0.4 mg total) under the tongue every 5 (five) minutes as needed for chest pain. 30 tablet 1  ? omeprazole (PRILOSEC) 20 MG capsule Take 20 mg by mouth daily.    ? rosuvastatin (CRESTOR) 20 MG tablet Take 1 tablet (20 mg total) by mouth daily. 90 tablet 3  ? ?No current facility-administered medications for this visit.  ? ? ?(Not in a hospital admission) ? ? ?Family History  ?Problem Relation Age of Onset  ? Cancer Mother   ? Heart attack Father   ? Heart disease Brother   ? Diabetes Brother   ? ? ? ?Review of Systems:  ? ?Review of Systems  ?Constitutional:  Negative for malaise/fatigue and  weight loss.  ?Respiratory:  Positive for shortness of breath.   ?Cardiovascular:  Negative for chest pain and leg swelling.  ?Musculoskeletal: Negative.   ?  ? ?Physical Exam: ?BP (!) 180/72   Pulse 76   Resp 20   Ht '5\' 10"'$  (1.778 m)   Wt 164 lb (74.4 kg)   SpO2 98% Comment: RA  BMI 23.53 kg/m?  ?Physical Exam ?Constitutional:   ?   General: He is not in acute distress. ?   Appearance: Normal appearance. He is normal weight.  ?HENT:  ?   Head: Normocephalic and atraumatic.  ?Cardiovascular:  ?   Rate and Rhythm: Normal rate.  ?Pulmonary:  ?   Effort: Pulmonary effort is normal. No respiratory distress.  ?Abdominal:  ?   General: Abdomen is flat. There is no distension.  ?Musculoskeletal:  ?   Cervical back: Normal range of motion.  ?Skin: ?   General: Skin  is warm and dry.  ?Neurological:  ?   General: No focal deficit present.  ?   Mental Status: He is alert and oriented to person, place, and time.  ?  ? ? ?Diagnostic Studies & Laboratory data: ?   ?Left Heart Catherization: ?Intervention ? ?Echo: ? 1. Left ventricular ejection fraction, by estimation, is 60 to 65%. The  ?left ventricle has normal function. The left ventricle has no regional  ?wall motion abnormalities. Left ventricular diastolic parameters are  ?consistent with Grade I diastolic  ?dysfunction (impaired relaxation).  ? 2. Right ventricular systolic function is normal. The right ventricular  ?size is normal.  ? 3. The mitral valve is normal in structure. Trivial mitral valve  ?regurgitation. No evidence of mitral stenosis.  ? 4. The aortic valve is normal in structure. Aortic valve regurgitation is  ?mild to moderate. No aortic stenosis is present.  ? 5. The inferior vena cava is normal in size with greater than 50%  ?respiratory variability, suggesting right atrial pressure of 3 mmHg. ? ?EKG: ?Sinus RBBB ?I have independently reviewed the above radiologic studies and discussed with the patient  ? ?Recent Lab Findings: ?Lab Results  ?Component Value Date  ? WBC 7.1 05/03/2021  ? HGB 15.7 05/03/2021  ? HCT 43.7 05/03/2021  ? PLT 149 (L) 05/03/2021  ? GLUCOSE 106 (H) 05/03/2021  ? CHOL 253 (H) 04/15/2021  ? TRIG 130 04/15/2021  ? HDL 46 04/15/2021  ? LDLCALC 183 (H) 04/15/2021  ? ALT 18 04/15/2021  ? AST 24 04/15/2021  ? NA 139 05/03/2021  ? K 4.5 05/03/2021  ? CL 104 05/03/2021  ? CREATININE 0.79 05/03/2021  ? BUN 17 05/03/2021  ? CO2 21 05/03/2021  ? ? ? ? ?Assessment / Plan:   ?76 year old male presents with three-vessel coronary artery disease, and mild to moderate aortic valve regurgitation.  Minimally symptomatic however review of his left heart catheterization he does have significant disease including a 70% left main stenosis.  He would like to proceed with surgical revascularization.  The risks  and benefits and details of the procedure were discussed.  In regards to his aortic valve it is more on the mild side on my read.  Given his age, I do not have any plans to replace the valve at this point.  He will be scheduled for CABG x4 on may 10th 2023. ? ? ? ? ?I  spent 40 minutes counseling the patient face to face. ? ? ?[name] Joseff Luckman ?05/10/2021 11:19 AM ? ? ? ? ? ? ?

## 2021-05-10 NOTE — Progress Notes (Signed)
Surgical Instructions ? ? ? Your procedure is scheduled on 05/15/21. ? Report to [facility] Main Entrance "A" at 6:30 A.M., then check in with the Admitting office. ? Call this number if you have problems the morning of surgery: ? [phone] ? ? If you have any questions prior to your surgery date call [phone]: Open Monday-Friday 8am-4pm ? ? ? Remember: ? Do not eat or drink after midnight the night before your surgery ? ? ?  ? Take these medicines the morning of surgery with A SIP OF WATER:  ?metoprolol succinate (TOPROL XL) ?omeprazole (PRILOSEC) ?rosuvastatin (CRESTOR) ? ?As of today, STOP taking any Aspirin (unless otherwise instructed by your surgeon) Aleve, Naproxen, Ibuprofen, Motrin, Advil, Goody's, BC's, all herbal medications, fish oil, and all vitamins. ? ?         ?Do not wear jewelry or makeup ?Do not wear lotions, powders, perfumes/colognes, or deodorant. ?Men may shave face and neck. ?Do not bring valuables to the hospital. ?Do not wear nail polish, gel polish, artificial nails, or any other type of covering on natural nails (fingers and toes) ?If you have artificial nails or gel coating that need to be removed by a nail salon, please have this removed prior to surgery. Artificial nails or gel coating may interfere with anesthesia's ability to adequately monitor your vital signs. ? ?[facility] is not responsible for any belongings or valuables. .  ? ?Do NOT Smoke (Tobacco/Vaping)  24 hours prior to your procedure ? ?If you use a CPAP at night, you may bring your mask for your overnight stay. ?  ?Contacts, glasses, hearing aids, dentures or partials may not be worn into surgery, please bring cases for these belongings ?  ?For patients admitted to the hospital, discharge time will be determined by your treatment team. ?  ?Patients discharged the day of surgery will not be allowed to drive home, and someone needs to stay with them for 24 hours. ? ? ?SURGICAL WAITING ROOM VISITATION ?Patients  having surgery or a procedure in a hospital may have two support people. ?Children under the age of 13 must have an adult with them who is not the patient. ?They may stay in the waiting area during the procedure and may switch out with other visitors. If the patient needs to stay at the hospital during part of their recovery, the visitor guidelines for inpatient rooms apply. ? ?Please refer to the [facility] website for the visitor guidelines for Inpatients (after your surgery is over and you are in a regular room).  ? ? ? ? ? ?Special instructions:   ? ?Oral Hygiene is also important to reduce your risk of infection.  Remember - BRUSH YOUR TEETH THE MORNING OF SURGERY WITH YOUR REGULAR TOOTHPASTE ? ? ?- Preparing For Surgery ? ?Before surgery, you can play an important role. Because skin is not sterile, your skin needs to be as free of germs as possible. You can reduce the number of germs on your skin by washing with CHG (chlorahexidine gluconate) Soap before surgery.  CHG is an antiseptic cleaner which kills germs and bonds with the skin to continue killing germs even after washing.   ? ? ?Please do not use if you have an allergy to CHG or antibacterial soaps. If your skin becomes reddened/irritated stop using the CHG.  ?Do not shave (including legs and underarms) for at least 48 hours prior to first CHG shower. It is OK to shave your face. ? ?Please follow these  instructions carefully. ?  ? ? Shower the NIGHT BEFORE SURGERY and the MORNING OF SURGERY with CHG Soap.  ? If you chose to wash your hair, wash your hair first as usual with your normal shampoo. After you shampoo, rinse your hair and body thoroughly to remove the shampoo.  Then [employer] and genitals (private parts) with your normal soap and rinse thoroughly to remove soap. ? ?After that Use CHG Soap as you would any other liquid soap. You can apply CHG directly to the skin and wash gently with a scrungie or a clean washcloth.  ? ?Apply the  CHG Soap to your body ONLY FROM THE NECK DOWN.  Do not use on open wounds or open sores. Avoid contact with your eyes, ears, mouth and genitals (private parts). Wash Face and genitals (private parts)  with your normal soap.  ? ?Wash thoroughly, paying special attention to the area where your surgery will be performed. ? ?Thoroughly rinse your body with warm water from the neck down. ? ?DO NOT shower/wash with your normal soap after using and rinsing off the CHG Soap. ? ?Pat yourself dry with a CLEAN TOWEL. ? ?Wear CLEAN PAJAMAS to bed the night before surgery ? ?Place CLEAN SHEETS on your bed the night before your surgery ? ?DO NOT SLEEP WITH PETS. ? ? ?Day of Surgery: ?Take a shower with CHG soap. ?Wear Clean/Comfortable clothing the morning of surgery ?Do not apply any deodorants/lotions.   ?Remember to brush your teeth WITH YOUR REGULAR TOOTHPASTE. ? ? ? ?If you received a COVID test during your pre-op visit, it is requested that you wear a mask when out in public, stay away from anyone that may not be feeling well, and notify your surgeon if you develop symptoms. If you have been in contact with anyone that has tested positive in the last 10 days, please notify your surgeon. ? ?  ?Please read over the following fact sheets that you were given.  ? ?

## 2021-05-13 ENCOUNTER — Other Ambulatory Visit: Payer: Self-pay

## 2021-05-13 ENCOUNTER — Encounter (HOSPITAL_COMMUNITY)
Admission: RE | Admit: 2021-05-13 | Discharge: 2021-05-13 | Disposition: A | Discharge status: Home / Self Care | Payer: Medicare HMO | Source: Ambulatory Visit | Attending: Thoracic Surgery (Cardiothoracic Vascular Surgery) | Admitting: Thoracic Surgery (Cardiothoracic Vascular Surgery)

## 2021-05-13 ENCOUNTER — Ambulatory Visit (HOSPITAL_COMMUNITY)
Admission: RE | Admit: 2021-05-13 | Discharge: 2021-05-13 | Disposition: A | Discharge status: Home / Self Care | Payer: Medicare HMO | Source: Ambulatory Visit | Attending: Thoracic Surgery (Cardiothoracic Vascular Surgery) | Admitting: Thoracic Surgery (Cardiothoracic Vascular Surgery)

## 2021-05-13 ENCOUNTER — Encounter (HOSPITAL_COMMUNITY): Payer: Self-pay

## 2021-05-13 ENCOUNTER — Ambulatory Visit (HOSPITAL_BASED_OUTPATIENT_CLINIC_OR_DEPARTMENT_OTHER)
Admission: RE | Admit: 2021-05-13 | Discharge: 2021-05-13 | Disposition: A | Discharge status: Home / Self Care | Payer: Medicare HMO | Source: Ambulatory Visit | Attending: Thoracic Surgery (Cardiothoracic Vascular Surgery) | Admitting: Thoracic Surgery (Cardiothoracic Vascular Surgery)

## 2021-05-13 VITALS — BP 126/61 | HR 75 | Temp 97.9°F | Resp 17 | Ht 70.0 in | Wt 168.7 lb

## 2021-05-13 DIAGNOSIS — I2510 Atherosclerotic heart disease of native coronary artery without angina pectoris: Secondary | ICD-10-CM

## 2021-05-13 DIAGNOSIS — I6523 Occlusion and stenosis of bilateral carotid arteries: Secondary | ICD-10-CM | POA: Insufficient documentation

## 2021-05-13 DIAGNOSIS — Z20822 Contact with and (suspected) exposure to covid-19: Secondary | ICD-10-CM | POA: Insufficient documentation

## 2021-05-13 DIAGNOSIS — Z01818 Encounter for other preprocedural examination: Secondary | ICD-10-CM

## 2021-05-13 HISTORY — DX: Headache, unspecified: R51.9

## 2021-05-13 LAB — COMPREHENSIVE METABOLIC PANEL
ALT: 19 U/L (ref 0–44)
AST: 25 U/L (ref 15–41)
Albumin: 3.9 g/dL (ref 3.5–5.0)
Alkaline Phosphatase: 81 U/L (ref 38–126)
Anion gap: 8 (ref 5–15)
BUN: 13 mg/dL (ref 8–23)
CO2: 22 mmol/L (ref 22–32)
Calcium: 9 mg/dL (ref 8.9–10.3)
Chloride: 109 mmol/L (ref 98–111)
Creatinine, Ser: 0.74 mg/dL (ref 0.61–1.24)
GFR, Estimated: >60 mL/min (ref >60)
Glucose, Bld: 93 mg/dL (ref 70–99)
Potassium: 4.4 mmol/L (ref 3.5–5.1)
Sodium: 139 mmol/L (ref 135–145)
Total Bilirubin: 0.5 mg/dL (ref 0.3–1.2)
Total Protein: 6.7 g/dL (ref 6.5–8.1)

## 2021-05-13 LAB — URINALYSIS, ROUTINE W REFLEX MICROSCOPIC
Bilirubin Urine: NEGATIVE
Glucose, UA: NEGATIVE mg/dL
Hgb urine dipstick: NEGATIVE
Ketones, ur: NEGATIVE mg/dL
Leukocytes,Ua: NEGATIVE
Nitrite: NEGATIVE
Protein, ur: NEGATIVE mg/dL
Specific Gravity, Urine: 1.01 (ref 1.005–1.030)
pH: 5 (ref 5.0–8.0)

## 2021-05-13 LAB — TYPE AND SCREEN
ABO/RH(D): O NEG
Antibody Screen: NEGATIVE

## 2021-05-13 LAB — CBC
HCT: 44.9 % (ref 39.0–52.0)
Hemoglobin: 15.3 g/dL (ref 13.0–17.0)
MCH: 29.8 pg (ref 26.0–34.0)
MCHC: 34.1 g/dL (ref 30.0–36.0)
MCV: 87.5 fL (ref 80.0–100.0)
Platelets: 145 10*3/uL — ABNORMAL LOW (ref 150–400)
RBC: 5.13 MIL/uL (ref 4.22–5.81)
RDW: 11.9 % (ref 11.5–15.5)
WBC: 8.7 10*3/uL (ref 4.0–10.5)
nRBC: 0 % (ref 0.0–0.2)

## 2021-05-13 LAB — HEMOGLOBIN A1C
Hgb A1c MFr Bld: 5.8 % — ABNORMAL HIGH (ref 4.8–5.6)
Mean Plasma Glucose: 119.76 mg/dL

## 2021-05-13 LAB — APTT: aPTT: 28 seconds (ref 24–36)

## 2021-05-13 LAB — SURGICAL PCR SCREEN
MRSA, PCR: NEGATIVE
Staphylococcus aureus: NEGATIVE

## 2021-05-13 LAB — BLOOD GAS, ARTERIAL
Acid-Base Excess: 1.3 mmol/L (ref 0.0–2.0)
Bicarbonate: 25.9 mmol/L (ref 20.0–28.0)
Drawn by: 602861
O2 Content: 21 L/min
O2 Saturation: 99.3 %
Patient temperature: 37
pCO2 arterial: 40 mmHg (ref 32–48)
pH, Arterial: 7.42 (ref 7.35–7.45)
pO2, Arterial: 115 mmHg — ABNORMAL HIGH (ref 83–108)

## 2021-05-13 LAB — PROTIME-INR
INR: 0.9 (ref 0.8–1.2)
Prothrombin Time: 12.2 seconds (ref 11.4–15.2)

## 2021-05-13 LAB — SARS CORONAVIRUS 2 (TAT 6-24 HRS): SARS Coronavirus 2: NEGATIVE

## 2021-05-13 NOTE — Progress Notes (Signed)
Pre-CABG Dopplers completed. ?Refer to "CV Proc" under chart review to view preliminary results. ? ?05/13/2021 9:42 AM ?Kelby Aline., MHA, RVT, RDCS, RDMS   ?

## 2021-05-13 NOTE — Progress Notes (Signed)
Surgical Instructions ? ? ? Your procedure is scheduled on 05/15/21. ? Report to [facility] Main Entrance "A" at 6:30 A.M., then check in with the Admitting office. ? Call this number if you have problems the morning of surgery: ? [phone] ? ? If you have any questions prior to your surgery date call [phone]: Open Monday-Friday 8am-4pm ? ? ? Remember: ? Do not eat or drink after midnight the night before your surgery ? ? ?  ? Take these medicines the morning of surgery with A SIP OF WATER:  ?metoprolol succinate (TOPROL XL) ?omeprazole (PRILOSEC) ?rosuvastatin (CRESTOR) ?Please follow your surgeon's instructions regarding Aspirin. If you have not received instructions then please contact your surgeon's office for instructions. ? ?As of today, STOP taking any Aspirin (unless otherwise instructed by your surgeon) Aleve, Naproxen, Ibuprofen, Motrin, Advil, Goody's, BC's, all herbal medications, fish oil, and all vitamins. ? ?         ?Do not wear jewelry or makeup ?Do not wear lotions, powders, perfumes/colognes, or deodorant. ?Men may shave face and neck. ?Do not bring valuables to the hospital. ?Do not wear nail polish, gel polish, artificial nails, or any other type of covering on natural nails (fingers and toes) ?If you have artificial nails or gel coating that need to be removed by a nail salon, please have this removed prior to surgery. Artificial nails or gel coating may interfere with anesthesia's ability to adequately monitor your vital signs. ? ?[facility] is not responsible for any belongings or valuables. .  ? ?Do NOT Smoke (Tobacco/Vaping)  24 hours prior to your procedure ? ?If you use a CPAP at night, you may bring your mask for your overnight stay. ?  ?Contacts, glasses, hearing aids, dentures or partials may not be worn into surgery, please bring cases for these belongings ?  ?For patients admitted to the hospital, discharge time will be determined by your treatment team. ?  ?Patients  discharged the day of surgery will not be allowed to drive home, and someone needs to stay with them for 24 hours. ? ? ?SURGICAL WAITING ROOM VISITATION ?Patients having surgery or a procedure in a hospital may have two support people. ?Children under the age of 29 must have an adult with them who is not the patient. ?They may stay in the waiting area during the procedure and may switch out with other visitors. If the patient needs to stay at the hospital during part of their recovery, the visitor guidelines for inpatient rooms apply. ? ?Please refer to the [facility] website for the visitor guidelines for Inpatients (after your surgery is over and you are in a regular room).  ? ? ? ? ? ?Special instructions:   ? ?Oral Hygiene is also important to reduce your risk of infection.  Remember - BRUSH YOUR TEETH THE MORNING OF SURGERY WITH YOUR REGULAR TOOTHPASTE ? ? ?[facility]- Preparing For Surgery ? ?Before surgery, you can play an important role. Because skin is not sterile, your skin needs to be as free of germs as possible. You can reduce the number of germs on your skin by washing with CHG (chlorahexidine gluconate) Soap before surgery.  CHG is an antiseptic cleaner which kills germs and bonds with the skin to continue killing germs even after washing.   ? ? ?Please do not use if you have an allergy to CHG or antibacterial soaps. If your skin becomes reddened/irritated stop using the CHG.  ?Do not shave (including legs and underarms)  for at least 48 hours prior to first CHG shower. It is OK to shave your face. ? ?Please follow these instructions carefully. ?  ? ? Shower the NIGHT BEFORE SURGERY and the MORNING OF SURGERY with CHG Soap.  ? If you chose to wash your hair, wash your hair first as usual with your normal shampoo. After you shampoo, rinse your hair and body thoroughly to remove the shampoo.  Then [employer] and genitals (private parts) with your normal soap and rinse thoroughly to remove soap. ? ?After  that Use CHG Soap as you would any other liquid soap. You can apply CHG directly to the skin and wash gently with a scrungie or a clean washcloth.  ? ?Apply the CHG Soap to your body ONLY FROM THE NECK DOWN.  Do not use on open wounds or open sores. Avoid contact with your eyes, ears, mouth and genitals (private parts). Wash Face and genitals (private parts)  with your normal soap.  ? ?Wash thoroughly, paying special attention to the area where your surgery will be performed. ? ?Thoroughly rinse your body with warm water from the neck down. ? ?DO NOT shower/wash with your normal soap after using and rinsing off the CHG Soap. ? ?Pat yourself dry with a CLEAN TOWEL. ? ?Wear CLEAN PAJAMAS to bed the night before surgery ? ?Place CLEAN SHEETS on your bed the night before your surgery ? ?DO NOT SLEEP WITH PETS. ? ? ?Day of Surgery: ?Take a shower with CHG soap. ?Wear Clean/Comfortable clothing the morning of surgery ?Do not apply any deodorants/lotions.   ?Remember to brush your teeth WITH YOUR REGULAR TOOTHPASTE. ? ? ? ?If you received a COVID test during your pre-op visit, it is requested that you wear a mask when out in public, stay away from anyone that may not be feeling well, and notify your surgeon if you develop symptoms. If you have been in contact with anyone that has tested positive in the last 10 days, please notify your surgeon. ? ?  ?Please read over the following fact sheets that you were given.  ? ?

## 2021-05-13 NOTE — Progress Notes (Signed)
PCP - Dr. Christa See ?Cardiologist - Dr. Shirlee More ? ?PPM/ICD - n/a ?Device Orders - n/a ?Rep Notified - n/a ? ?Chest x-ray - 05/13/21 ?EKG - 05/03/21 ?Stress Test -  ?ECHO - 05/08/21 ?Cardiac Cath - 05/08/21 ? ?Sleep Study - denies ?CPAP - n/a ? ?Fasting Blood Sugar - n/a ?Checks Blood Sugar _____ times a day-n/a ? ?Blood Thinner Instructions: n/a ?Aspirin Instructions: Please follow your surgeon's instructions regarding Aspirin. If you have not received instructions then please contact your surgeon's office for instructions.  ? ?ERAS Protcol - NPO ? ?COVID TEST- 05/13/21. Pending.  ? ? ?Anesthesia review: Yes ? ?Patient denies shortness of breath, fever, cough and chest pain at PAT appointment ? ? ?All instructions explained to the patient, with a verbal understanding of the material. Patient agrees to go over the instructions while at home for a better understanding. The opportunity to ask questions was provided. ? ? ?

## 2021-05-14 MED ORDER — PLASMA-LYTE A IV SOLN
INTRAVENOUS | Status: DC
  Filled 2021-05-14: qty 2.5

## 2021-05-14 MED ORDER — NOREPINEPHRINE 4 MG/250ML-% IV SOLN
0.0000 ug/min | INTRAVENOUS | Status: DC
  Filled 2021-05-14: qty 250

## 2021-05-14 MED ORDER — INSULIN REGULAR(HUMAN) IN NACL 100-0.9 UT/100ML-% IV SOLN
INTRAVENOUS | Status: AC
  Administered 2021-05-15: 1.3 [IU]/h via INTRAVENOUS
  Filled 2021-05-14: qty 100

## 2021-05-14 MED ORDER — EPINEPHRINE HCL 5 MG/250ML IV SOLN IN NS
0.0000 ug/min | INTRAVENOUS | Status: DC
  Filled 2021-05-14: qty 250

## 2021-05-14 MED ORDER — CEFAZOLIN SODIUM-DEXTROSE 2-4 GM/100ML-% IV SOLN
2.0000 g | INTRAVENOUS | Status: AC
Start: 2021-05-15 — End: 2021-05-15
  Administered 2021-05-15: 2 g via INTRAVENOUS
  Filled 2021-05-14: qty 100

## 2021-05-14 MED ORDER — MANNITOL 20 % IV SOLN
INTRAVENOUS | Status: DC
  Filled 2021-05-14: qty 13

## 2021-05-14 MED ORDER — VANCOMYCIN HCL 1250 MG/250ML IV SOLN
1250.0000 mg | INTRAVENOUS | Status: AC
  Administered 2021-05-15: 1250 mg via INTRAVENOUS
  Filled 2021-05-14: qty 250

## 2021-05-14 MED ORDER — TRANEXAMIC ACID 1000 MG/10ML IV SOLN
1.5000 mg/kg/h | INTRAVENOUS | Status: AC
  Administered 2021-05-15: 1.5 mg/kg/h via INTRAVENOUS
  Filled 2021-05-14: qty 25

## 2021-05-14 MED ORDER — DEXMEDETOMIDINE HCL IN NACL 400 MCG/100ML IV SOLN
0.1000 ug/kg/h | INTRAVENOUS | Status: AC
  Administered 2021-05-15: .4 ug/kg/h via INTRAVENOUS
  Filled 2021-05-14: qty 100

## 2021-05-14 MED ORDER — HEPARIN 30,000 UNITS/1000 ML (OHS) CELLSAVER SOLUTION
Status: DC
  Filled 2021-05-14: qty 1000

## 2021-05-14 MED ORDER — CEFAZOLIN SODIUM-DEXTROSE 2-4 GM/100ML-% IV SOLN
2.0000 g | INTRAVENOUS | Status: AC
  Administered 2021-05-15: 2 g via INTRAVENOUS
  Filled 2021-05-14: qty 100

## 2021-05-14 MED ORDER — NITROGLYCERIN IN D5W 200-5 MCG/ML-% IV SOLN
2.0000 ug/min | INTRAVENOUS | Status: DC
  Filled 2021-05-14: qty 250

## 2021-05-14 MED ORDER — MILRINONE LACTATE IN DEXTROSE 20-5 MG/100ML-% IV SOLN
0.3000 ug/kg/min | INTRAVENOUS | Status: DC
  Filled 2021-05-14: qty 100

## 2021-05-14 MED ORDER — TRANEXAMIC ACID (OHS) BOLUS VIA INFUSION
15.0000 mg/kg | INTRAVENOUS | Status: AC
  Administered 2021-05-15: 1147.5 mg via INTRAVENOUS
  Filled 2021-05-14: qty 1148

## 2021-05-14 MED ORDER — PHENYLEPHRINE HCL-NACL 20-0.9 MG/250ML-% IV SOLN
30.0000 ug/min | INTRAVENOUS | Status: AC
  Administered 2021-05-15: 25 ug/min via INTRAVENOUS
  Filled 2021-05-14: qty 250

## 2021-05-14 MED ORDER — POTASSIUM CHLORIDE 2 MEQ/ML IV SOLN
80.0000 meq | INTRAVENOUS | Status: DC
  Filled 2021-05-14: qty 40

## 2021-05-14 MED ORDER — TRANEXAMIC ACID (OHS) PUMP PRIME SOLUTION
2.0000 mg/kg | INTRAVENOUS | Status: DC
  Filled 2021-05-14: qty 1.53

## 2021-05-14 NOTE — Anesthesia Preprocedure Evaluation (Addendum)
Anesthesia Evaluation  ?Patient identified by MRN, date of birth, ID band ?Patient awake ? ? ? ?Reviewed: ?Allergy & Precautions, NPO status  ? ?Airway ?Mallampati: II ? ?TM Distance: >3 FB ? ? ? ? Dental ?  ?Pulmonary ?neg pulmonary ROS,  ?  ?breath sounds clear to auscultation ? ? ? ? ? ? Cardiovascular ?+ angina + CAD  ? ?Rhythm:Regular Rate:Normal ? ? ?  ?Neuro/Psych ?  ? GI/Hepatic ?Neg liver ROS, GERD  ,  ?Endo/Other  ?negative endocrine ROS ? Renal/GU ?negative Renal ROS  ? ?  ?Musculoskeletal ? ? Abdominal ?  ?Peds ? Hematology ?  ?Anesthesia Other Findings ? ? Reproductive/Obstetrics ? ?  ? ? ? ? ? ? ? ? ? ? ? ? ? ?  ?  ? ? ? ? ? ? ? ?Anesthesia Physical ?Anesthesia Plan ? ?ASA: 3 ? ?Anesthesia Plan: General  ? ?Post-op Pain Management:   ? ?Induction: Intravenous ? ?PONV Risk Score and Plan: Ondansetron and Treatment may vary due to age or medical condition ? ?Airway Management Planned: Oral ETT ? ?Additional Equipment: Arterial line, TEE and CVP ? ?Intra-op Plan:  ? ?Post-operative Plan: Post-operative intubation/ventilation ? ?Informed Consent: I have reviewed the patients History and Physical, chart, labs and discussed the procedure including the risks, benefits and alternatives for the proposed anesthesia with the patient or authorized representative who has indicated his/her understanding and acceptance.  ? ? ? ?Dental advisory given ? ?Plan Discussed with: Anesthesiologist and CRNA ? ?Anesthesia Plan Comments:   ? ? ? ? ? ?Anesthesia Quick Evaluation ? ?

## 2021-05-15 ENCOUNTER — Inpatient Hospital Stay (HOSPITAL_COMMUNITY)
Admission: RE | Disposition: A | Discharge status: Home / Self Care | Payer: Self-pay | Source: Ambulatory Visit | Attending: Thoracic Surgery (Cardiothoracic Vascular Surgery)

## 2021-05-15 ENCOUNTER — Inpatient Hospital Stay (HOSPITAL_COMMUNITY): Payer: Medicare HMO

## 2021-05-15 ENCOUNTER — Other Ambulatory Visit: Payer: Self-pay

## 2021-05-15 ENCOUNTER — Encounter (HOSPITAL_COMMUNITY): Payer: Self-pay | Admitting: Thoracic Surgery (Cardiothoracic Vascular Surgery)

## 2021-05-15 ENCOUNTER — Inpatient Hospital Stay (HOSPITAL_COMMUNITY): Payer: Medicare HMO | Admitting: Physician Assistant

## 2021-05-15 ENCOUNTER — Inpatient Hospital Stay (HOSPITAL_COMMUNITY): Payer: Medicare HMO | Admitting: Anesthesiology

## 2021-05-15 ENCOUNTER — Inpatient Hospital Stay (HOSPITAL_COMMUNITY)
Admission: RE | Admit: 2021-05-15 | Discharge: 2021-05-21 | DRG: 236 | Disposition: A | Discharge status: Home / Self Care | Payer: Medicare HMO | Attending: Thoracic Surgery (Cardiothoracic Vascular Surgery) | Admitting: Thoracic Surgery (Cardiothoracic Vascular Surgery)

## 2021-05-15 DIAGNOSIS — J939 Pneumothorax, unspecified: Secondary | ICD-10-CM | POA: Diagnosis not present

## 2021-05-15 DIAGNOSIS — I10 Essential (primary) hypertension: Secondary | ICD-10-CM | POA: Diagnosis present

## 2021-05-15 DIAGNOSIS — K219 Gastro-esophageal reflux disease without esophagitis: Secondary | ICD-10-CM | POA: Diagnosis not present

## 2021-05-15 DIAGNOSIS — Z888 Allergy status to other drugs, medicaments and biological substances status: Secondary | ICD-10-CM | POA: Diagnosis not present

## 2021-05-15 DIAGNOSIS — Z8679 Personal history of other diseases of the circulatory system: Secondary | ICD-10-CM

## 2021-05-15 DIAGNOSIS — D72829 Elevated white blood cell count, unspecified: Secondary | ICD-10-CM | POA: Diagnosis not present

## 2021-05-15 DIAGNOSIS — D62 Acute posthemorrhagic anemia: Secondary | ICD-10-CM | POA: Diagnosis not present

## 2021-05-15 DIAGNOSIS — I2510 Atherosclerotic heart disease of native coronary artery without angina pectoris: Secondary | ICD-10-CM

## 2021-05-15 DIAGNOSIS — Q250 Patent ductus arteriosus: Secondary | ICD-10-CM

## 2021-05-15 DIAGNOSIS — I4510 Unspecified right bundle-branch block: Secondary | ICD-10-CM | POA: Diagnosis present

## 2021-05-15 DIAGNOSIS — Z951 Presence of aortocoronary bypass graft: Principal | ICD-10-CM

## 2021-05-15 DIAGNOSIS — Z7982 Long term (current) use of aspirin: Secondary | ICD-10-CM

## 2021-05-15 DIAGNOSIS — R079 Chest pain, unspecified: Secondary | ICD-10-CM | POA: Diagnosis not present

## 2021-05-15 DIAGNOSIS — E782 Mixed hyperlipidemia: Secondary | ICD-10-CM | POA: Diagnosis present

## 2021-05-15 DIAGNOSIS — Z79899 Other long term (current) drug therapy: Secondary | ICD-10-CM

## 2021-05-15 DIAGNOSIS — Z885 Allergy status to narcotic agent status: Secondary | ICD-10-CM | POA: Diagnosis not present

## 2021-05-15 DIAGNOSIS — I25118 Atherosclerotic heart disease of native coronary artery with other forms of angina pectoris: Secondary | ICD-10-CM | POA: Diagnosis not present

## 2021-05-15 DIAGNOSIS — I080 Rheumatic disorders of both mitral and aortic valves: Secondary | ICD-10-CM | POA: Diagnosis not present

## 2021-05-15 DIAGNOSIS — J9811 Atelectasis: Secondary | ICD-10-CM | POA: Diagnosis not present

## 2021-05-15 DIAGNOSIS — I5189 Other ill-defined heart diseases: Secondary | ICD-10-CM

## 2021-05-15 DIAGNOSIS — I4891 Unspecified atrial fibrillation: Secondary | ICD-10-CM | POA: Diagnosis not present

## 2021-05-15 DIAGNOSIS — I351 Nonrheumatic aortic (valve) insufficiency: Secondary | ICD-10-CM | POA: Diagnosis present

## 2021-05-15 DIAGNOSIS — Z20822 Contact with and (suspected) exposure to covid-19: Secondary | ICD-10-CM | POA: Diagnosis not present

## 2021-05-15 DIAGNOSIS — Z8249 Family history of ischemic heart disease and other diseases of the circulatory system: Secondary | ICD-10-CM | POA: Diagnosis not present

## 2021-05-15 DIAGNOSIS — I25119 Atherosclerotic heart disease of native coronary artery with unspecified angina pectoris: Secondary | ICD-10-CM

## 2021-05-15 DIAGNOSIS — D696 Thrombocytopenia, unspecified: Secondary | ICD-10-CM | POA: Diagnosis present

## 2021-05-15 HISTORY — PX: TEE WITHOUT CARDIOVERSION: SHX5443

## 2021-05-15 HISTORY — DX: Other ill-defined heart diseases: I51.89

## 2021-05-15 HISTORY — DX: Personal history of other diseases of the circulatory system: Z86.79

## 2021-05-15 HISTORY — PX: CORONARY ARTERY BYPASS GRAFT: SHX141

## 2021-05-15 HISTORY — DX: Nonrheumatic aortic (valve) insufficiency: I35.1

## 2021-05-15 HISTORY — DX: Presence of aortocoronary bypass graft: Z95.1

## 2021-05-15 LAB — POCT I-STAT 7, (LYTES, BLD GAS, ICA,H+H)
Acid-Base Excess: 2 mmol/L (ref 0.0–2.0)
Acid-Base Excess: 1 mmol/L (ref 0.0–2.0)
Acid-base deficit: 6 mmol/L — ABNORMAL HIGH (ref 0.0–2.0)
Acid-base deficit: 6 mmol/L — ABNORMAL HIGH (ref 0.0–2.0)
Acid-base deficit: 5 mmol/L — ABNORMAL HIGH (ref 0.0–2.0)
Bicarbonate: 26.4 mmol/L (ref 20.0–28.0)
Bicarbonate: 25 mmol/L (ref 20.0–28.0)
Bicarbonate: 19 mmol/L — ABNORMAL LOW (ref 20.0–28.0)
Bicarbonate: 18.2 mmol/L — ABNORMAL LOW (ref 20.0–28.0)
Bicarbonate: 19.9 mmol/L — ABNORMAL LOW (ref 20.0–28.0)
Calcium, Ion: 0.98 mmol/L — ABNORMAL LOW (ref 1.15–1.40)
Calcium, Ion: 1.06 mmol/L — ABNORMAL LOW (ref 1.15–1.40)
Calcium, Ion: 1.08 mmol/L — ABNORMAL LOW (ref 1.15–1.40)
Calcium, Ion: 1.08 mmol/L — ABNORMAL LOW (ref 1.15–1.40)
Calcium, Ion: 1.1 mmol/L — ABNORMAL LOW (ref 1.15–1.40)
HCT: 24 % — ABNORMAL LOW (ref 39.0–52.0)
HCT: 28 % — ABNORMAL LOW (ref 39.0–52.0)
HCT: 27 % — ABNORMAL LOW (ref 39.0–52.0)
HCT: 24 % — ABNORMAL LOW (ref 39.0–52.0)
HCT: 25 % — ABNORMAL LOW (ref 39.0–52.0)
Hemoglobin: 8.2 g/dL — ABNORMAL LOW (ref 13.0–17.0)
Hemoglobin: 9.5 g/dL — ABNORMAL LOW (ref 13.0–17.0)
Hemoglobin: 9.2 g/dL — ABNORMAL LOW (ref 13.0–17.0)
Hemoglobin: 8.2 g/dL — ABNORMAL LOW (ref 13.0–17.0)
Hemoglobin: 8.5 g/dL — ABNORMAL LOW (ref 13.0–17.0)
O2 Saturation: 100 %
O2 Saturation: 100 %
O2 Saturation: 99 %
O2 Saturation: 99 %
O2 Saturation: 99 %
Patient temperature: 36.3
Patient temperature: 37.6
Patient temperature: 37.2
Potassium: 5.4 mmol/L — ABNORMAL HIGH (ref 3.5–5.1)
Potassium: 5.1 mmol/L (ref 3.5–5.1)
Potassium: 3.2 mmol/L — ABNORMAL LOW (ref 3.5–5.1)
Potassium: 3.9 mmol/L (ref 3.5–5.1)
Potassium: 3.9 mmol/L (ref 3.5–5.1)
Sodium: 131 mmol/L — ABNORMAL LOW (ref 135–145)
Sodium: 134 mmol/L — ABNORMAL LOW (ref 135–145)
Sodium: 142 mmol/L (ref 135–145)
Sodium: 141 mmol/L (ref 135–145)
Sodium: 141 mmol/L (ref 135–145)
TCO2: 28 mmol/L (ref 22–32)
TCO2: 26 mmol/L (ref 22–32)
TCO2: 20 mmol/L — ABNORMAL LOW (ref 22–32)
TCO2: 19 mmol/L — ABNORMAL LOW (ref 22–32)
TCO2: 21 mmol/L — ABNORMAL LOW (ref 22–32)
pCO2 arterial: 37.1 mmHg (ref 32–48)
pCO2 arterial: 37.4 mmHg (ref 32–48)
pCO2 arterial: 34.1 mmHg (ref 32–48)
pCO2 arterial: 31.9 mmHg — ABNORMAL LOW (ref 32–48)
pCO2 arterial: 35.3 mmHg (ref 32–48)
pH, Arterial: 7.459 — ABNORMAL HIGH (ref 7.35–7.45)
pH, Arterial: 7.433 (ref 7.35–7.45)
pH, Arterial: 7.35 (ref 7.35–7.45)
pH, Arterial: 7.366 (ref 7.35–7.45)
pH, Arterial: 7.361 (ref 7.35–7.45)
pO2, Arterial: 383 mmHg — ABNORMAL HIGH (ref 83–108)
pO2, Arterial: 246 mmHg — ABNORMAL HIGH (ref 83–108)
pO2, Arterial: 160 mmHg — ABNORMAL HIGH (ref 83–108)
pO2, Arterial: 158 mmHg — ABNORMAL HIGH (ref 83–108)
pO2, Arterial: 158 mmHg — ABNORMAL HIGH (ref 83–108)

## 2021-05-15 LAB — GLUCOSE, CAPILLARY
Glucose-Capillary: 119 mg/dL — ABNORMAL HIGH (ref 70–99)
Glucose-Capillary: 149 mg/dL — ABNORMAL HIGH (ref 70–99)
Glucose-Capillary: 150 mg/dL — ABNORMAL HIGH (ref 70–99)
Glucose-Capillary: 76 mg/dL (ref 70–99)
Glucose-Capillary: 147 mg/dL — ABNORMAL HIGH (ref 70–99)
Glucose-Capillary: 138 mg/dL — ABNORMAL HIGH (ref 70–99)
Glucose-Capillary: 147 mg/dL — ABNORMAL HIGH (ref 70–99)
Glucose-Capillary: 179 mg/dL — ABNORMAL HIGH (ref 70–99)
Glucose-Capillary: 154 mg/dL — ABNORMAL HIGH (ref 70–99)
Glucose-Capillary: 146 mg/dL — ABNORMAL HIGH (ref 70–99)

## 2021-05-15 LAB — POCT I-STAT EG7
Acid-Base Excess: 0 mmol/L (ref 0.0–2.0)
Bicarbonate: 24.1 mmol/L (ref 20.0–28.0)
Calcium, Ion: 1.03 mmol/L — ABNORMAL LOW (ref 1.15–1.40)
HCT: 26 % — ABNORMAL LOW (ref 39.0–52.0)
Hemoglobin: 8.8 g/dL — ABNORMAL LOW (ref 13.0–17.0)
O2 Saturation: 87 %
Potassium: 5.3 mmol/L — ABNORMAL HIGH (ref 3.5–5.1)
Sodium: 131 mmol/L — ABNORMAL LOW (ref 135–145)
TCO2: 25 mmol/L (ref 22–32)
pCO2, Ven: 37.5 mmHg — ABNORMAL LOW (ref 44–60)
pH, Ven: 7.417 (ref 7.25–7.43)
pO2, Ven: 51 mmHg — ABNORMAL HIGH (ref 32–45)

## 2021-05-15 LAB — BASIC METABOLIC PANEL
Anion gap: 6 (ref 5–15)
BUN: 14 mg/dL (ref 8–23)
CO2: 22 mmol/L (ref 22–32)
Calcium: 7.5 mg/dL — ABNORMAL LOW (ref 8.9–10.3)
Chloride: 110 mmol/L (ref 98–111)
Creatinine, Ser: 0.86 mg/dL (ref 0.61–1.24)
GFR, Estimated: >60 mL/min (ref >60)
Glucose, Bld: 143 mg/dL — ABNORMAL HIGH (ref 70–99)
Potassium: 4.6 mmol/L (ref 3.5–5.1)
Sodium: 138 mmol/L (ref 135–145)

## 2021-05-15 LAB — ECHO INTRAOPERATIVE TEE
Height: 70 in
Weight: 2672 oz

## 2021-05-15 LAB — CBC
HCT: 31.5 % — ABNORMAL LOW (ref 39.0–52.0)
HCT: 30.5 % — ABNORMAL LOW (ref 39.0–52.0)
Hemoglobin: 10.9 g/dL — ABNORMAL LOW (ref 13.0–17.0)
Hemoglobin: 10.7 g/dL — ABNORMAL LOW (ref 13.0–17.0)
MCH: 30 pg (ref 26.0–34.0)
MCH: 30.1 pg (ref 26.0–34.0)
MCHC: 34.6 g/dL (ref 30.0–36.0)
MCHC: 35.1 g/dL (ref 30.0–36.0)
MCV: 86.8 fL (ref 80.0–100.0)
MCV: 85.7 fL (ref 80.0–100.0)
Platelets: 113 10*3/uL — ABNORMAL LOW (ref 150–400)
Platelets: 138 10*3/uL — ABNORMAL LOW (ref 150–400)
RBC: 3.63 MIL/uL — ABNORMAL LOW (ref 4.22–5.81)
RBC: 3.56 MIL/uL — ABNORMAL LOW (ref 4.22–5.81)
RDW: 11.9 % (ref 11.5–15.5)
RDW: 11.9 % (ref 11.5–15.5)
WBC: 16.9 10*3/uL — ABNORMAL HIGH (ref 4.0–10.5)
WBC: 18.7 10*3/uL — ABNORMAL HIGH (ref 4.0–10.5)
nRBC: 0 % (ref 0.0–0.2)
nRBC: 0 % (ref 0.0–0.2)

## 2021-05-15 LAB — POCT I-STAT, CHEM 8
BUN: 15 mg/dL (ref 8–23)
BUN: 13 mg/dL (ref 8–23)
BUN: 14 mg/dL (ref 8–23)
BUN: 13 mg/dL (ref 8–23)
Calcium, Ion: 1.24 mmol/L (ref 1.15–1.40)
Calcium, Ion: 1.13 mmol/L — ABNORMAL LOW (ref 1.15–1.40)
Calcium, Ion: 1.04 mmol/L — ABNORMAL LOW (ref 1.15–1.40)
Calcium, Ion: 1.34 mmol/L (ref 1.15–1.40)
Chloride: 102 mmol/L (ref 98–111)
Chloride: 106 mmol/L (ref 98–111)
Chloride: 99 mmol/L (ref 98–111)
Chloride: 103 mmol/L (ref 98–111)
Creatinine, Ser: 0.7 mg/dL (ref 0.61–1.24)
Creatinine, Ser: 0.6 mg/dL — ABNORMAL LOW (ref 0.61–1.24)
Creatinine, Ser: 0.5 mg/dL — ABNORMAL LOW (ref 0.61–1.24)
Creatinine, Ser: 0.5 mg/dL — ABNORMAL LOW (ref 0.61–1.24)
Glucose, Bld: 132 mg/dL — ABNORMAL HIGH (ref 70–99)
Glucose, Bld: 94 mg/dL (ref 70–99)
Glucose, Bld: 130 mg/dL — ABNORMAL HIGH (ref 70–99)
Glucose, Bld: 155 mg/dL — ABNORMAL HIGH (ref 70–99)
HCT: 39 % (ref 39.0–52.0)
HCT: 34 % — ABNORMAL LOW (ref 39.0–52.0)
HCT: 26 % — ABNORMAL LOW (ref 39.0–52.0)
HCT: 25 % — ABNORMAL LOW (ref 39.0–52.0)
Hemoglobin: 13.3 g/dL (ref 13.0–17.0)
Hemoglobin: 11.6 g/dL — ABNORMAL LOW (ref 13.0–17.0)
Hemoglobin: 8.8 g/dL — ABNORMAL LOW (ref 13.0–17.0)
Hemoglobin: 8.5 g/dL — ABNORMAL LOW (ref 13.0–17.0)
Potassium: 4.1 mmol/L (ref 3.5–5.1)
Potassium: 4 mmol/L (ref 3.5–5.1)
Potassium: 5.3 mmol/L — ABNORMAL HIGH (ref 3.5–5.1)
Potassium: 4.2 mmol/L (ref 3.5–5.1)
Sodium: 139 mmol/L (ref 135–145)
Sodium: 140 mmol/L (ref 135–145)
Sodium: 131 mmol/L — ABNORMAL LOW (ref 135–145)
Sodium: 136 mmol/L (ref 135–145)
TCO2: 28 mmol/L (ref 22–32)
TCO2: 25 mmol/L (ref 22–32)
TCO2: 27 mmol/L (ref 22–32)
TCO2: 27 mmol/L (ref 22–32)

## 2021-05-15 LAB — MAGNESIUM: Magnesium: 2.9 mg/dL — ABNORMAL HIGH (ref 1.7–2.4)

## 2021-05-15 LAB — HEMOGLOBIN AND HEMATOCRIT, BLOOD
HCT: 28.2 % — ABNORMAL LOW (ref 39.0–52.0)
Hemoglobin: 10.2 g/dL — ABNORMAL LOW (ref 13.0–17.0)

## 2021-05-15 LAB — ABO/RH: ABO/RH(D): O NEG

## 2021-05-15 LAB — PROTIME-INR
INR: 1.4 — ABNORMAL HIGH (ref 0.8–1.2)
Prothrombin Time: 16.6 seconds — ABNORMAL HIGH (ref 11.4–15.2)

## 2021-05-15 LAB — PLATELET COUNT: Platelets: 114 10*3/uL — ABNORMAL LOW (ref 150–400)

## 2021-05-15 LAB — APTT: aPTT: 31 seconds (ref 24–36)

## 2021-05-15 SURGERY — CORONARY ARTERY BYPASS GRAFTING (CABG)
Anesthesia: General | Site: Chest

## 2021-05-15 MED ORDER — PHENYLEPHRINE HCL-NACL 20-0.9 MG/250ML-% IV SOLN
0.0000 ug/min | INTRAVENOUS | Status: DC
  Administered 2021-05-15: 35 ug/min via INTRAVENOUS
  Filled 2021-05-15: qty 500

## 2021-05-15 MED ORDER — ASPIRIN EC 325 MG PO TBEC
325.0000 mg | DELAYED_RELEASE_TABLET | Freq: Every day | ORAL | Status: DC
  Administered 2021-05-16 – 2021-05-21 (×6): 325 mg via ORAL
  Filled 2021-05-15 (×6): qty 1

## 2021-05-15 MED ORDER — OXYCODONE HCL 5 MG PO TABS
5.0000 mg | ORAL_TABLET | ORAL | Status: DC | PRN
  Administered 2021-05-15 – 2021-05-16 (×4): 10 mg via ORAL
  Filled 2021-05-15 (×4): qty 2

## 2021-05-15 MED ORDER — MAGNESIUM SULFATE 4 GM/100ML IV SOLN
INTRAVENOUS | Status: AC
  Filled 2021-05-15: qty 100

## 2021-05-15 MED ORDER — LACTATED RINGERS IV SOLN
500.0000 mL | Freq: Once | INTRAVENOUS | Status: AC | PRN
Start: 2021-05-15 — End: 2021-05-16
  Administered 2021-05-16: 500 mL via INTRAVENOUS

## 2021-05-15 MED ORDER — SODIUM CHLORIDE 0.9 % IV SOLN: INTRAVENOUS | Status: DC | PRN

## 2021-05-15 MED ORDER — METOPROLOL TARTRATE 12.5 MG HALF TABLET
12.5000 mg | ORAL_TABLET | Freq: Once | ORAL | Status: AC
  Administered 2021-05-15: 12.5 mg via ORAL
  Filled 2021-05-15: qty 1

## 2021-05-15 MED ORDER — CHLORHEXIDINE GLUCONATE CLOTH 2 % EX PADS
6.0000 | MEDICATED_PAD | Freq: Every day | CUTANEOUS | Status: DC
  Administered 2021-05-15 – 2021-05-19 (×3): 6 via TOPICAL

## 2021-05-15 MED ORDER — BISACODYL 10 MG RE SUPP: 10.0000 mg | Freq: Every day | RECTAL | Status: DC

## 2021-05-15 MED ORDER — TRAMADOL HCL 50 MG PO TABS
50.0000 mg | ORAL_TABLET | ORAL | Status: DC | PRN
  Administered 2021-05-16 – 2021-05-17 (×4): 100 mg via ORAL
  Filled 2021-05-15 (×4): qty 2

## 2021-05-15 MED ORDER — METOPROLOL TARTRATE 25 MG/10 ML ORAL SUSPENSION
12.5000 mg | Freq: Two times a day (BID) | ORAL | Status: DC
  Filled 2021-05-15 (×4): qty 5

## 2021-05-15 MED ORDER — METOPROLOL TARTRATE 12.5 MG HALF TABLET
12.5000 mg | ORAL_TABLET | Freq: Two times a day (BID) | ORAL | Status: DC
  Administered 2021-05-16 – 2021-05-21 (×10): 12.5 mg via ORAL
  Filled 2021-05-15 (×10): qty 1

## 2021-05-15 MED ORDER — PROPOFOL 10 MG/ML IV BOLUS
INTRAVENOUS | Status: DC | PRN
  Administered 2021-05-15: 100 mg via INTRAVENOUS

## 2021-05-15 MED ORDER — SUCCINYLCHOLINE CHLORIDE 200 MG/10ML IV SOSY
PREFILLED_SYRINGE | INTRAVENOUS | Status: AC
  Filled 2021-05-15: qty 10

## 2021-05-15 MED ORDER — ROCURONIUM BROMIDE 10 MG/ML (PF) SYRINGE
PREFILLED_SYRINGE | INTRAVENOUS | Status: DC | PRN
  Administered 2021-05-15: 50 mg via INTRAVENOUS
  Administered 2021-05-15: 30 mg via INTRAVENOUS
  Administered 2021-05-15 (×2): 50 mg via INTRAVENOUS

## 2021-05-15 MED ORDER — SODIUM CHLORIDE (PF) 0.9 % IJ SOLN
INTRAMUSCULAR | Status: DC | PRN
  Administered 2021-05-15: 4 mL via TOPICAL

## 2021-05-15 MED ORDER — ARTIFICIAL TEARS OPHTHALMIC OINT
TOPICAL_OINTMENT | OPHTHALMIC | Status: DC | PRN
  Administered 2021-05-15: 1 via OPHTHALMIC

## 2021-05-15 MED ORDER — PLASMA-LYTE A IV SOLN
INTRAVENOUS | Status: DC | PRN
  Administered 2021-05-15: 500 mL via INTRAVASCULAR

## 2021-05-15 MED ORDER — PHENYLEPHRINE 80 MCG/ML (10ML) SYRINGE FOR IV PUSH (FOR BLOOD PRESSURE SUPPORT)
PREFILLED_SYRINGE | INTRAVENOUS | Status: DC | PRN
  Administered 2021-05-15: 160 ug via INTRAVENOUS
  Administered 2021-05-15 (×2): 80 ug via INTRAVENOUS
  Administered 2021-05-15: 160 ug via INTRAVENOUS
  Administered 2021-05-15: 80 ug via INTRAVENOUS

## 2021-05-15 MED ORDER — FENTANYL CITRATE (PF) 250 MCG/5ML IJ SOLN
INTRAMUSCULAR | Status: DC | PRN
  Administered 2021-05-15: 100 ug via INTRAVENOUS
  Administered 2021-05-15: 150 ug via INTRAVENOUS
  Administered 2021-05-15: 200 ug via INTRAVENOUS
  Administered 2021-05-15 (×2): 150 ug via INTRAVENOUS
  Administered 2021-05-15: 50 ug via INTRAVENOUS
  Administered 2021-05-15 (×2): 100 ug via INTRAVENOUS

## 2021-05-15 MED ORDER — MAGNESIUM SULFATE 4 GM/100ML IV SOLN
4.0000 g | Freq: Once | INTRAVENOUS | Status: AC
  Administered 2021-05-15: 4 g via INTRAVENOUS

## 2021-05-15 MED ORDER — METOPROLOL TARTRATE 5 MG/5ML IV SOLN
2.5000 mg | INTRAVENOUS | Status: DC | PRN
  Filled 2021-05-15: qty 5

## 2021-05-15 MED ORDER — CHLORHEXIDINE GLUCONATE 4 % EX LIQD: 30.0000 mL | CUTANEOUS | Status: DC

## 2021-05-15 MED ORDER — HEPARIN SODIUM (PORCINE) 1000 UNIT/ML IJ SOLN
INTRAMUSCULAR | Status: AC
  Filled 2021-05-15: qty 1

## 2021-05-15 MED ORDER — ROCURONIUM BROMIDE 10 MG/ML (PF) SYRINGE
PREFILLED_SYRINGE | INTRAVENOUS | Status: AC
  Filled 2021-05-15: qty 10

## 2021-05-15 MED ORDER — LACTATED RINGERS IV SOLN: INTRAVENOUS | Status: DC

## 2021-05-15 MED ORDER — MIDAZOLAM HCL 2 MG/2ML IJ SOLN: 2.0000 mg | INTRAMUSCULAR | Status: DC | PRN

## 2021-05-15 MED ORDER — DOBUTAMINE IN D5W 4-5 MG/ML-% IV SOLN: 0.0000 ug/kg/min | INTRAVENOUS | Status: DC

## 2021-05-15 MED ORDER — LACTATED RINGERS IV SOLN: INTRAVENOUS | Status: DC | PRN

## 2021-05-15 MED ORDER — SODIUM CHLORIDE 0.9% FLUSH
10.0000 mL | Freq: Two times a day (BID) | INTRAVENOUS | Status: DC
  Administered 2021-05-15 – 2021-05-17 (×4): 10 mL
  Administered 2021-05-17: 40 mL
  Administered 2021-05-18 (×2): 10 mL

## 2021-05-15 MED ORDER — ARTIFICIAL TEARS OPHTHALMIC OINT
TOPICAL_OINTMENT | OPHTHALMIC | Status: AC
  Filled 2021-05-15: qty 3.5

## 2021-05-15 MED ORDER — MIDAZOLAM HCL (PF) 10 MG/2ML IJ SOLN
INTRAMUSCULAR | Status: AC
  Filled 2021-05-15: qty 2

## 2021-05-15 MED ORDER — FENTANYL CITRATE (PF) 250 MCG/5ML IJ SOLN
INTRAMUSCULAR | Status: AC
  Filled 2021-05-15: qty 5

## 2021-05-15 MED ORDER — SODIUM CHLORIDE 0.9% FLUSH: 10.0000 mL | INTRAVENOUS | Status: DC | PRN

## 2021-05-15 MED ORDER — SODIUM CHLORIDE 0.9% FLUSH
3.0000 mL | Freq: Two times a day (BID) | INTRAVENOUS | Status: DC
  Administered 2021-05-16 – 2021-05-21 (×11): 3 mL via INTRAVENOUS

## 2021-05-15 MED ORDER — PANTOPRAZOLE SODIUM 40 MG PO TBEC
40.0000 mg | DELAYED_RELEASE_TABLET | Freq: Every day | ORAL | Status: DC
  Administered 2021-05-17 – 2021-05-21 (×5): 40 mg via ORAL
  Filled 2021-05-15 (×5): qty 1

## 2021-05-15 MED ORDER — CHLORHEXIDINE GLUCONATE 0.12 % MT SOLN
15.0000 mL | OROMUCOSAL | Status: AC
  Administered 2021-05-15: 15 mL via OROMUCOSAL

## 2021-05-15 MED ORDER — VANCOMYCIN HCL IN DEXTROSE 1-5 GM/200ML-% IV SOLN
1000.0000 mg | Freq: Once | INTRAVENOUS | Status: AC
  Administered 2021-05-15: 1000 mg via INTRAVENOUS
  Filled 2021-05-15: qty 200

## 2021-05-15 MED ORDER — ACETAMINOPHEN 650 MG RE SUPP
650.0000 mg | Freq: Once | RECTAL | Status: AC
  Administered 2021-05-15: 650 mg via RECTAL

## 2021-05-15 MED ORDER — DEXAMETHASONE SODIUM PHOSPHATE 10 MG/ML IJ SOLN
INTRAMUSCULAR | Status: DC | PRN
  Administered 2021-05-15: 4 mg via INTRAVENOUS

## 2021-05-15 MED ORDER — PROPOFOL 10 MG/ML IV BOLUS
INTRAVENOUS | Status: AC
  Filled 2021-05-15: qty 20

## 2021-05-15 MED ORDER — NICARDIPINE HCL IN NACL 20-0.86 MG/200ML-% IV SOLN: 0.0000 mg/h | INTRAVENOUS | Status: DC

## 2021-05-15 MED ORDER — HEPARIN SODIUM (PORCINE) 1000 UNIT/ML IJ SOLN
INTRAMUSCULAR | Status: DC | PRN
  Administered 2021-05-15: 23000 [IU] via INTRAVENOUS

## 2021-05-15 MED ORDER — FAMOTIDINE IN NACL 20-0.9 MG/50ML-% IV SOLN
INTRAVENOUS | Status: AC
  Filled 2021-05-15: qty 50

## 2021-05-15 MED ORDER — MORPHINE SULFATE (PF) 2 MG/ML IV SOLN
1.0000 mg | INTRAVENOUS | Status: DC | PRN
  Administered 2021-05-15 – 2021-05-16 (×4): 2 mg via INTRAVENOUS
  Administered 2021-05-16: 4 mg via INTRAVENOUS
  Filled 2021-05-15: qty 2
  Filled 2021-05-15 (×3): qty 1

## 2021-05-15 MED ORDER — ASPIRIN 81 MG PO CHEW: 324.0000 mg | CHEWABLE_TABLET | Freq: Every day | ORAL | Status: DC

## 2021-05-15 MED ORDER — SODIUM CHLORIDE 0.9% FLUSH
3.0000 mL | INTRAVENOUS | Status: DC | PRN
Start: 2021-05-16 — End: 2021-05-21

## 2021-05-15 MED ORDER — PROTAMINE SULFATE 10 MG/ML IV SOLN
INTRAVENOUS | Status: DC | PRN
  Administered 2021-05-15: 220 mg via INTRAVENOUS

## 2021-05-15 MED ORDER — NOREPINEPHRINE 4 MG/250ML-% IV SOLN
0.0000 ug/min | INTRAVENOUS | Status: DC
  Administered 2021-05-15: 2 ug/min via INTRAVENOUS
  Filled 2021-05-15 (×2): qty 250

## 2021-05-15 MED ORDER — PROTAMINE SULFATE 10 MG/ML IV SOLN
INTRAVENOUS | Status: AC
  Filled 2021-05-15: qty 25

## 2021-05-15 MED ORDER — ~~LOC~~ CARDIAC SURGERY, PATIENT & FAMILY EDUCATION
Freq: Once | Status: DC
  Filled 2021-05-15: qty 1

## 2021-05-15 MED ORDER — SODIUM CHLORIDE 0.9 % IV SOLN: INTRAVENOUS | Status: DC

## 2021-05-15 MED ORDER — LIDOCAINE 2% (20 MG/ML) 5 ML SYRINGE
INTRAMUSCULAR | Status: AC
  Filled 2021-05-15: qty 5

## 2021-05-15 MED ORDER — CEFAZOLIN SODIUM-DEXTROSE 2-4 GM/100ML-% IV SOLN
2.0000 g | Freq: Three times a day (TID) | INTRAVENOUS | Status: AC
  Administered 2021-05-15 – 2021-05-17 (×6): 2 g via INTRAVENOUS
  Filled 2021-05-15 (×6): qty 100

## 2021-05-15 MED ORDER — DEXMEDETOMIDINE HCL IN NACL 400 MCG/100ML IV SOLN: 0.0000 ug/kg/h | INTRAVENOUS | Status: DC

## 2021-05-15 MED ORDER — ALBUMIN HUMAN 5 % IV SOLN
250.0000 mL | INTRAVENOUS | Status: AC | PRN
  Administered 2021-05-15 – 2021-05-16 (×4): 12.5 g via INTRAVENOUS
  Filled 2021-05-15 (×2): qty 250

## 2021-05-15 MED ORDER — SODIUM CHLORIDE 0.9 % IV SOLN
250.0000 mL | INTRAVENOUS | Status: DC
  Administered 2021-05-16: 250 mL via INTRAVENOUS

## 2021-05-15 MED ORDER — ONDANSETRON HCL 4 MG/2ML IJ SOLN
4.0000 mg | Freq: Four times a day (QID) | INTRAMUSCULAR | Status: DC | PRN
  Administered 2021-05-15 – 2021-05-18 (×4): 4 mg via INTRAVENOUS
  Filled 2021-05-15 (×4): qty 2

## 2021-05-15 MED ORDER — ROSUVASTATIN CALCIUM 20 MG PO TABS
20.0000 mg | ORAL_TABLET | Freq: Every day | ORAL | Status: DC
Start: 2021-05-16 — End: 2021-05-21
  Administered 2021-05-16 – 2021-05-21 (×6): 20 mg via ORAL
  Filled 2021-05-15 (×6): qty 1

## 2021-05-15 MED ORDER — ACETAMINOPHEN 500 MG PO TABS
1000.0000 mg | ORAL_TABLET | Freq: Four times a day (QID) | ORAL | Status: AC
  Administered 2021-05-16 – 2021-05-20 (×16): 1000 mg via ORAL
  Filled 2021-05-15 (×17): qty 2

## 2021-05-15 MED ORDER — ONDANSETRON HCL 4 MG/2ML IJ SOLN
INTRAMUSCULAR | Status: DC | PRN
  Administered 2021-05-15: 4 mg via INTRAVENOUS

## 2021-05-15 MED ORDER — DEXTROSE 50 % IV SOLN: 0.0000 mL | INTRAVENOUS | Status: DC | PRN

## 2021-05-15 MED ORDER — SODIUM CHLORIDE 0.45 % IV SOLN: INTRAVENOUS | Status: DC | PRN

## 2021-05-15 MED ORDER — POTASSIUM CHLORIDE 10 MEQ/50ML IV SOLN
10.0000 meq | INTRAVENOUS | Status: AC
  Administered 2021-05-15 (×3): 10 meq via INTRAVENOUS

## 2021-05-15 MED ORDER — 0.9 % SODIUM CHLORIDE (POUR BTL) OPTIME
TOPICAL | Status: DC | PRN
  Administered 2021-05-15: 5000 mL

## 2021-05-15 MED ORDER — DOCUSATE SODIUM 100 MG PO CAPS
200.0000 mg | ORAL_CAPSULE | Freq: Every day | ORAL | Status: DC
  Administered 2021-05-16 – 2021-05-21 (×4): 200 mg via ORAL
  Filled 2021-05-15 (×7): qty 2

## 2021-05-15 MED ORDER — INSULIN REGULAR(HUMAN) IN NACL 100-0.9 UT/100ML-% IV SOLN: INTRAVENOUS | Status: AC

## 2021-05-15 MED ORDER — ACETAMINOPHEN 160 MG/5ML PO SOLN: 1000.0000 mg | Freq: Four times a day (QID) | ORAL | Status: AC

## 2021-05-15 MED ORDER — BISACODYL 5 MG PO TBEC
10.0000 mg | DELAYED_RELEASE_TABLET | Freq: Every day | ORAL | Status: DC
  Administered 2021-05-16 – 2021-05-18 (×3): 10 mg via ORAL
  Filled 2021-05-15 (×4): qty 2

## 2021-05-15 MED ORDER — FAMOTIDINE IN NACL 20-0.9 MG/50ML-% IV SOLN
20.0000 mg | Freq: Two times a day (BID) | INTRAVENOUS | Status: AC
  Administered 2021-05-15: 20 mg via INTRAVENOUS

## 2021-05-15 MED ORDER — MIDAZOLAM HCL (PF) 5 MG/ML IJ SOLN
INTRAMUSCULAR | Status: DC | PRN
  Administered 2021-05-15: 1 mg via INTRAVENOUS
  Administered 2021-05-15: 2 mg via INTRAVENOUS
  Administered 2021-05-15: 1 mg via INTRAVENOUS
  Administered 2021-05-15: 4 mg via INTRAVENOUS

## 2021-05-15 MED ORDER — CHLORHEXIDINE GLUCONATE 0.12 % MT SOLN
15.0000 mL | Freq: Once | OROMUCOSAL | Status: AC
  Administered 2021-05-15: 15 mL via OROMUCOSAL
  Filled 2021-05-15: qty 15

## 2021-05-15 MED ORDER — ACETAMINOPHEN 160 MG/5ML PO SOLN: 650.0000 mg | Freq: Once | ORAL | Status: AC

## 2021-05-15 MED ORDER — CHLORHEXIDINE GLUCONATE CLOTH 2 % EX PADS
6.0000 | MEDICATED_PAD | Freq: Every day | CUTANEOUS | Status: DC
  Administered 2021-05-16 – 2021-05-17 (×2): 6 via TOPICAL

## 2021-05-15 SURGICAL SUPPLY — 86 items
BAG DECANTER FOR FLEXI CONT (MISCELLANEOUS) ×3 IMPLANT
BLADE CLIPPER SURG (BLADE) ×2 IMPLANT
BLADE STERNUM SYSTEM 6 (BLADE) ×3 IMPLANT
BNDG ELASTIC 4X5.8 VLCR STR LF (GAUZE/BANDAGES/DRESSINGS) ×3 IMPLANT
BNDG ELASTIC 6X10 VLCR STRL LF (GAUZE/BANDAGES/DRESSINGS) ×1 IMPLANT
BNDG ELASTIC 6X5.8 VLCR STR LF (GAUZE/BANDAGES/DRESSINGS) ×4 IMPLANT
BNDG GAUZE ELAST 4 BULKY (GAUZE/BANDAGES/DRESSINGS) ×3 IMPLANT
CABLE SURGICAL S-101-97-12 (CABLE) ×2 IMPLANT
CANISTER SUCT 3000ML PPV (MISCELLANEOUS) ×3 IMPLANT
CANNULA MC2 2 STG 29/37 NON-V (CANNULA) ×2 IMPLANT
CANNULA MC2 TWO STAGE (CANNULA) ×1
CANNULA NON VENT 20FR 12 (CANNULA) ×3 IMPLANT
CATH ROBINSON RED A/P 18FR (CATHETERS) ×6 IMPLANT
CLIP RETRACTION 3.0MM CORONARY (MISCELLANEOUS) ×2 IMPLANT
CLIP VESOCCLUDE MED 24/CT (CLIP) IMPLANT
CLIP VESOCCLUDE SM WIDE 24/CT (CLIP) IMPLANT
CONN ST 1/2X1/2  BEN (MISCELLANEOUS) ×1
CONN ST 1/2X1/2 BEN (MISCELLANEOUS) ×2 IMPLANT
CONNECTOR BLAKE 2:1 CARIO BLK (MISCELLANEOUS) ×3 IMPLANT
CONTAINER PROTECT SURGISLUSH (MISCELLANEOUS) ×6 IMPLANT
DERMABOND ADHESIVE PROPEN (GAUZE/BANDAGES/DRESSINGS) ×1
DERMABOND ADVANCED .7 DNX6 (GAUZE/BANDAGES/DRESSINGS) IMPLANT
DRAIN CHANNEL 19F RND (DRAIN) ×9 IMPLANT
DRAIN CONNECTOR BLAKE 1:1 (MISCELLANEOUS) ×3 IMPLANT
DRAPE CARDIOVASCULAR INCISE (DRAPES) ×1
DRAPE INCISE IOBAN 66X45 STRL (DRAPES) IMPLANT
DRAPE SRG 135X102X78XABS (DRAPES) ×2 IMPLANT
DRAPE WARM FLUID 44X44 (DRAPES) ×3 IMPLANT
DRSG AQUACEL AG ADV 3.5X 6 (GAUZE/BANDAGES/DRESSINGS) ×1 IMPLANT
DRSG AQUACEL AG ADV 3.5X10 (GAUZE/BANDAGES/DRESSINGS) ×2 IMPLANT
DRSG COVADERM 4X14 (GAUZE/BANDAGES/DRESSINGS) ×2 IMPLANT
ELECT BLADE 4.0 EZ CLEAN MEGAD (MISCELLANEOUS) ×3
ELECT REM PT RETURN 9FT ADLT (ELECTROSURGICAL) ×6
ELECTRODE BLDE 4.0 EZ CLN MEGD (MISCELLANEOUS) ×2 IMPLANT
ELECTRODE REM PT RTRN 9FT ADLT (ELECTROSURGICAL) ×4 IMPLANT
FELT TEFLON 1X6 (MISCELLANEOUS) ×5 IMPLANT
GAUZE 4X4 16PLY ~~LOC~~+RFID DBL (SPONGE) ×3 IMPLANT
GAUZE SPONGE 4X4 12PLY STRL (GAUZE/BANDAGES/DRESSINGS) ×6 IMPLANT
GLOVE BIO SURGEON STRL SZ7 (GLOVE) ×6 IMPLANT
GLOVE BIOGEL M STRL SZ7.5 (GLOVE) ×6 IMPLANT
GOWN STRL REUS W/ TWL LRG LVL3 (GOWN DISPOSABLE) ×8 IMPLANT
GOWN STRL REUS W/ TWL XL LVL3 (GOWN DISPOSABLE) ×4 IMPLANT
GOWN STRL REUS W/TWL LRG LVL3 (GOWN DISPOSABLE) ×4
GOWN STRL REUS W/TWL XL LVL3 (GOWN DISPOSABLE) ×4
HEMOSTAT POWDER SURGIFOAM 1G (HEMOSTASIS) ×8 IMPLANT
INSERT SUTURE HOLDER (MISCELLANEOUS) ×3 IMPLANT
KIT BASIN OR (CUSTOM PROCEDURE TRAY) ×3 IMPLANT
KIT SUCTION CATH 14FR (SUCTIONS) ×3 IMPLANT
KIT TURNOVER KIT B (KITS) ×3 IMPLANT
KIT VASOVIEW HEMOPRO 2 VH 4000 (KITS) ×3 IMPLANT
LEAD PACING MYOCARDI (MISCELLANEOUS) ×3 IMPLANT
MARKER GRAFT CORONARY BYPASS (MISCELLANEOUS) ×9 IMPLANT
NS IRRIG 1000ML POUR BTL (IV SOLUTION) ×15 IMPLANT
PACK ACCESSORY CANNULA KIT (KITS) ×3 IMPLANT
PACK E OPEN HEART (SUTURE) ×3 IMPLANT
PACK OPEN HEART (CUSTOM PROCEDURE TRAY) ×3 IMPLANT
PAD ARMBOARD 7.5X6 YLW CONV (MISCELLANEOUS) ×6 IMPLANT
PAD ELECT DEFIB RADIOL ZOLL (MISCELLANEOUS) ×3 IMPLANT
PENCIL BUTTON HOLSTER BLD 10FT (ELECTRODE) ×3 IMPLANT
PENCIL SMOKE EVACUATOR (MISCELLANEOUS) ×1 IMPLANT
POSITIONER HEAD DONUT 9IN (MISCELLANEOUS) ×3 IMPLANT
PUNCH AORTIC ROTATE 4.0MM (MISCELLANEOUS) ×3 IMPLANT
SET MPS 3-ND DEL (MISCELLANEOUS) ×1 IMPLANT
SLEEVE SUCTION 125 (MISCELLANEOUS) ×1 IMPLANT
SPONGE T-LAP 18X18 ~~LOC~~+RFID (SPONGE) ×14 IMPLANT
STAPLER VISISTAT 35W (STAPLE) ×3 IMPLANT
SUPPORT HEART JANKE-BARRON (MISCELLANEOUS) ×3 IMPLANT
SUT BONE WAX W31G (SUTURE) ×3 IMPLANT
SUT ETHIBOND X763 2 0 SH 1 (SUTURE) ×6 IMPLANT
SUT MNCRL AB 3-0 PS2 18 (SUTURE) ×6 IMPLANT
SUT PDS AB 1 CTX 36 (SUTURE) ×6 IMPLANT
SUT PROLENE 4 0 SH DA (SUTURE) ×3 IMPLANT
SUT PROLENE 5 0 C 1 36 (SUTURE) ×9 IMPLANT
SUT PROLENE 7 0 BV 1 (SUTURE) ×1 IMPLANT
SUT PROLENE 7 0 BV1 MDA (SUTURE) ×3 IMPLANT
SUT STEEL 6MS V (SUTURE) ×6 IMPLANT
SUT VIC AB 2-0 CT1 27 (SUTURE) ×6
SUT VIC AB 2-0 CT1 TAPERPNT 27 (SUTURE) IMPLANT
SYSTEM SAHARA CHEST DRAIN ATS (WOUND CARE) ×3 IMPLANT
TAPE CLOTH SURG 4X10 WHT LF (GAUZE/BANDAGES/DRESSINGS) ×1 IMPLANT
TOWEL GREEN STERILE (TOWEL DISPOSABLE) ×3 IMPLANT
TOWEL GREEN STERILE FF (TOWEL DISPOSABLE) ×3 IMPLANT
TRAY FOLEY SLVR 16FR TEMP STAT (SET/KITS/TRAYS/PACK) ×3 IMPLANT
TUBING LAP HI FLOW INSUFFLATIO (TUBING) ×3 IMPLANT
UNDERPAD 30X36 HEAVY ABSORB (UNDERPADS AND DIAPERS) ×3 IMPLANT
WATER STERILE IRR 1000ML POUR (IV SOLUTION) ×6 IMPLANT

## 2021-05-15 NOTE — Progress Notes (Signed)
? ?   ?[address] ?      [place] [address] ?            [phone]   ? ?  ?Day of Surgery ? ?Procedure(s) (LRB): ?CORONARY ARTERY BYPASS GRAFTING (CABG) TIMES THREE   USING LEFT INTERNAL MAMMARY ARTERY AND RIGHT AND LEFT SAPHENOUS VEIN (N/A) ?TRANSESOPHAGEAL ECHOCARDIOGRAM (TEE) (N/A) ? ? ?Total Length of Stay:  LOS: 0 days  ? ? ?SUBJECTIVE: ? ?Early post-op. ?Sedation off but not awake yet.  ?No events since return from OR.  ? ?Vitals:  ? 05/15/21 1600 05/15/21 1800  ?BP:    ?Pulse: 75 84  ?Resp: 12 (!) 22  ?Temp: (!) 97.2 ?F (36.2 ?C) (!) 96.3 ?F (35.7 ?C)  ?SpO2: 100% 100%  ? ? ?Intake/Output   ?   05/09 0701 ?05/10 0700 05/10 0701 ?05/11 0700  ? I.V. (mL/kg)  2790.7 (36.9)  ? Blood  370  ? IV Piggyback  594.8  ? Total Intake(mL/kg)  3755.5 (49.6)  ? Urine (mL/kg/hr)  1625 (1.8)  ? Other  2764  ? Blood  569  ? Chest Tube  130  ? Total Output  5088  ? Net  -1332.5  ?     ?  ? ? ? sodium chloride    ? [START ON 05/16/2021] sodium chloride    ? sodium chloride 10 mL/hr at 05/15/21 1813  ? albumin human 12.5 g (05/15/21 1838)  ?  ceFAZolin (ANCEF) IV Stopped (05/15/21 1606)  ? dexmedetomidine (PRECEDEX) IV infusion 0.7 mcg/kg/hr (05/15/21 1404)  ? DOBUTamine    ? famotidine (PEPCID) IV Stopped (05/15/21 1511)  ? insulin Stopped (05/15/21 1709)  ? lactated ringers    ? lactated ringers    ? lactated ringers 20 mL/hr at 05/15/21 1800  ? magnesium sulfate 20 mL/hr at 05/15/21 1800  ? niCARDipine    ? norepinephrine (LEVOPHED) Adult infusion    ? phenylephrine (NEO-SYNEPHRINE) Adult infusion 35 mcg/min (05/15/21 1800)  ? vancomycin    ? ? ?CBC ?   ?Component Value Date/Time  ? WBC 16.9 (H) 05/15/2021 1427  ? RBC 3.63 (L) 05/15/2021 1427  ? HGB 10.9 (L) 05/15/2021 1427  ? HGB 15.7 05/03/2021 1007  ? HCT 31.5 (L) 05/15/2021 1427  ? HCT 43.7 05/03/2021 1007  ? PLT 113 (L) 05/15/2021 1427  ? PLT 149 (L) 05/03/2021 1007  ? MCV 86.8 05/15/2021 1427  ? MCV 85 05/03/2021 1007  ? MCH 30.0 05/15/2021 1427  ?  MCHC 34.6 05/15/2021 1427  ? RDW 11.9 05/15/2021 1427  ? RDW 11.9 05/03/2021 1007  ? ?CMP  ?   ?Component Value Date/Time  ? NA 136 05/15/2021 1312  ? NA 139 05/03/2021 1007  ? K 4.2 05/15/2021 1312  ? CL 103 05/15/2021 1312  ? CO2 22 05/13/2021 1130  ? GLUCOSE 155 (H) 05/15/2021 1312  ? BUN 13 05/15/2021 1312  ? BUN 17 05/03/2021 1007  ? CREATININE 0.50 (L) 05/15/2021 1312  ? CALCIUM 9.0 05/13/2021 1130  ? PROT 6.7 05/13/2021 1130  ? PROT 6.5 04/15/2021 1029  ? ALBUMIN 3.9 05/13/2021 1130  ? ALBUMIN 4.2 04/15/2021 1029  ? AST 25 05/13/2021 1130  ? ALT 19 05/13/2021 1130  ? ALKPHOS 81 05/13/2021 1130  ? BILITOT 0.5 05/13/2021 1130  ? BILITOT 0.5 04/15/2021 1029  ? GFRNONAA >60 05/13/2021 1130  ? ?ABG ?   ?Component Value Date/Time  ? PHART 7.433 05/15/2021 1246  ? PCO2ART 37.4 05/15/2021  1246  ? PO2ART 246 (H) 05/15/2021 1246  ? HCO3 25.0 05/15/2021 1246  ? TCO2 27 05/15/2021 1312  ? O2SAT 100 05/15/2021 1246  ? ?CBG (last 3)  ?Recent Labs  ?  05/15/21 ?9381 05/15/21 ?1707 05/15/21 ?1804  ?GLUCAP 150* 76 147*  ? ? ? ?ASSESSMENT: ?Stable hemodynamics and cardiac rhythm early post-op CABG. ?Minimal CT drainage.   ? ?Antony Odea, PA-C ? ? ? ? ? ?

## 2021-05-15 NOTE — Anesthesia Procedure Notes (Signed)
Arterial Line Insertion ?Start/End5/10/2021 6:45 AM, 05/15/2021 6:50 AM ?Performed by: Lowella Dell, CRNA ? Patient location: Pre-op. ?Preanesthetic checklist: patient identified, IV checked, site marked, risks and benefits discussed, surgical consent, monitors and equipment checked, pre-op evaluation, timeout performed and anesthesia consent ?Lidocaine 1% used for infiltration ?Left, radial was placed ?Catheter size: 20 G ?Hand hygiene performed  and maximum sterile barriers used  ? ?Attempts: 1 ?Procedure performed without using ultrasound guided technique. ?Following insertion, dressing applied and Biopatch. ?Post procedure assessment: normal and unchanged ? ?Patient tolerated the procedure well with no immediate complications. ? ? ?

## 2021-05-15 NOTE — Progress Notes (Signed)
?  Transition of Care (TOC) Screening Note ? ? ?Patient Details  ?Name: Derek Wood ?Date of Birth: 10-Aug-1945 ? ? ?Transition of Care (TOC) CM/SW Contact:    ?Milas Gain, LCSWA ?Phone Number: ?05/15/2021, 3:32 PM ? ? ? ?Transition of Care Department [facility]) has reviewed patient and no TOC needs have been identified at this time. We will continue to monitor patient advancement through interdisciplinary progression rounds. If new patient transition needs arise, please place a TOC consult. ?  ?

## 2021-05-15 NOTE — Hospital Course (Addendum)
History of Present Illness:     ?76 year old male presents for surgical evaluation of the coronary artery disease.  He does have a strong family history of coronary disease and that his brother and father both died from heart attacks.  He denies any anginal symptoms, but on deeper probing does admit to some exertional shortness of breath.  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  His cardiac catheterization revealed significant disease including 70% left main stenosis.  Dr. Kipp Brood evaluated the patient and studies and recommended proceeding with surgical coronary artery revascularization.  He was admitted this hospitalization for the procedure. ? ?Hospital course: ? ?The patient was taken the operating room on 05/15/2021 at which time he underwent CABG x3.  He tolerated the procedure well was taken to the surgical intensive care unit in stable condition. ? ?Postoperative hospital course: ? ?The patient was extubated without difficulty using standard post cardiac surgical protocols.  He did initially require some inotropic support with Levophed.  This was weaned off by postoperative day #1.  Chest tube output on postoperative day #1 was moderate and therefore tubes were left in place.  He does have a expected acute blood loss anemia which is being monitored clinically.  He has been started on iron supplementation.  It is not at the transfusion threshold.  He has maintained good urine output with normal renal function.  He is started on a course of routine post cardiac rehab phase 1 modalities.  Oxygen is being weaned using standard post op incentive spirometry and pulmonary hygiene maneuvers.  On postoperative day #2 he developed atrial fibrillation with an RVR and has been subsequently started on intravenous amiodarone.  Chest tubes were removed on postoperative day #2 as drainage had significantly decreased. He converted to sinus rhythm and was  transitioned to oral Amiodarone. He did have nausea post op. He was given Zofran and a laxative. He had a bowel movement and nausea did resolve. He was felt surgically stable for transfer from the ICU to 4E for further convalescence on 05/14. He is ambulating on room air with good oxygenation. All wounds are clean, dry, healing without signs of infection.  At the time of discharge the patient is felt to be quite stable. ?

## 2021-05-15 NOTE — Transfer of Care (Signed)
Immediate Anesthesia Transfer of Care Note ? ?Patient: Derek Wood ? ?Procedure(s) Performed: CORONARY ARTERY BYPASS GRAFTING (CABG) TIMES THREE   USING LEFT INTERNAL MAMMARY ARTERY AND RIGHT AND LEFT SAPHENOUS VEIN (Chest) ?TRANSESOPHAGEAL ECHOCARDIOGRAM (TEE) ? ?Patient Location: ICU ? ?Anesthesia Type:General ? ?Level of Consciousness: sedated ? ?Airway & Oxygen Therapy: Patient remains intubated per anesthesia plan and Patient placed on Ventilator (see vital sign flow sheet for setting) ? ?Post-op Assessment: Report given to RN and Post -op Vital signs reviewed and stable ? ?Post vital signs: Reviewed and stable ? ?Last Vitals:  ?Vitals Value Taken Time  ?BP 108/50 05/15/21 1407  ?Temp 36.2 ?C 05/15/21 1412  ?Pulse 77 05/15/21 1412  ?Resp 12 05/15/21 1412  ?SpO2 98 % 05/15/21 1412  ?Vitals shown include unvalidated device data. ? ?Last Pain:  ?Vitals:  ? 05/15/21 0705  ?PainSc: 0-No pain  ?   ? ?  ? ?Complications: No notable events documented. ?

## 2021-05-15 NOTE — Anesthesia Procedure Notes (Addendum)
?  Central Venous Catheter Insertion ?Performed by: Belinda Block, MD, anesthesiologist ?Start/End5/10/2021 8:00 AM, 05/15/2021 8:15 AM ?Patient location: Pre-op. ?Preanesthetic checklist: patient identified, IV checked, site marked, risks and benefits discussed, surgical consent, monitors and equipment checked, pre-op evaluation, timeout performed and anesthesia consent ?Lidocaine 1% used for infiltration and patient sedated ?Hand hygiene performed  and maximum sterile barriers used  ?Catheter size: 8.5 Fr ?Sheath introducer ?Procedure performed using ultrasound guided technique. ?Ultrasound Notes:anatomy identified, needle tip was noted to be adjacent to the nerve/plexus identified, no ultrasound evidence of intravascular and/or intraneural injection and image(s) printed for medical record ?Attempts: 1 ?Following insertion, line sutured and dressing applied. ?Post procedure assessment: blood return through all ports, free fluid flow and no air ? ?Patient tolerated the procedure well with no immediate complications. ?Additional procedure comments: Flow track inserted without difficulty.. ? ? ? ? ?

## 2021-05-15 NOTE — Interval H&P Note (Signed)
History and Physical Interval Note: ? ?05/15/2021 ?8:39 AM ? ?Derek Wood  has presented today for surgery, with the diagnosis of CAD.  The various methods of treatment have been discussed with the patient and family. After consideration of risks, benefits and other options for treatment, the patient has consented to  Procedure(s): ?CORONARY ARTERY BYPASS GRAFTING (CABG) (N/A) ?TRANSESOPHAGEAL ECHOCARDIOGRAM (TEE) (N/A) as a surgical intervention.  The patient's history has been reviewed, patient examined, no change in status, stable for surgery.  I have reviewed the patient's chart and labs.  Questions were answered to the patient's satisfaction.   ? ? ?[name] [name] ? ? ?

## 2021-05-15 NOTE — Anesthesia Postprocedure Evaluation (Signed)
Anesthesia Post Note ? ?Patient: Derek Wood ? ?Procedure(s) Performed: CORONARY ARTERY BYPASS GRAFTING (CABG) TIMES THREE   USING LEFT INTERNAL MAMMARY ARTERY AND RIGHT AND LEFT SAPHENOUS VEIN (Chest) ?TRANSESOPHAGEAL ECHOCARDIOGRAM (TEE) ? ?  ? ?Patient location during evaluation: SICU ?Anesthesia Type: General ?Level of consciousness: patient remains intubated per anesthesia plan ?Pain management: pain level controlled ?Vital Signs Assessment: post-procedure vital signs reviewed and stable ?Respiratory status: patient remains intubated per anesthesia plan ?Cardiovascular status: stable ?Postop Assessment: no apparent nausea or vomiting ?Anesthetic complications: no ? ? ?No notable events documented. ? ?Last Vitals:  ?Vitals:  ? 05/15/21 0842 05/15/21 1407  ?BP:  (!) 108/50  ?Pulse: 68 76  ?Resp: 15 12  ?Temp:    ?SpO2: 99% 100%  ?  ?Last Pain:  ?Vitals:  ? 05/15/21 0705  ?PainSc: 0-No pain  ? ? ?  ?  ?  ?  ?  ?  ? ?Phuong Hillary ? ? ? ? ?

## 2021-05-15 NOTE — Consult Note (Signed)
? ?NAME:  Derek Wood, MRN:  [id number], DOB:  [birth year], LOS: 0 ?ADMISSION DATE:  05/15/2021, CONSULTATION DATE:  5/10 ?REFERRING MD:  Kipp Brood, CHIEF COMPLAINT:  post-op critical care   ? ?History of Present Illness:  ? ?76 year old male w/ 3V CAD and PMH as outlined below. Went to OR 5/10 for elective 4V CABG.  ?PCCM asked to assist w/ post-op management  ?Pertinent  Medical History  ?3 V CAD w/ mod AVR (with strong family dx of heart disease); baseline ECHO 60-65% no RWM abnormality; grade I diastolic dysfxn ?Baseline RBBB ?GERD ?HL ? ?Significant Hospital Events: ?Including procedures, antibiotic start and stop dates in addition to other pertinent events   ?5/10 4 V CABG  ? ?Interim History / Subjective:  ?Sedated post-op  ? ?Objective   ?Blood pressure 121/70, pulse 68, temperature 97.7 ?F (36.5 ?C), resp. rate 15, height '5\' 10"'$  (1.778 m), weight 75.8 kg, SpO2 99 %. ?   ?   ? ?Intake/Output Summary (Last 24 hours) at 05/15/2021 1145 ?Last data filed at 05/15/2021 1104 ?Gross per 24 hour  ?Intake 1250 ml  ?Output 400 ml  ?Net 850 ml  ? ?Filed Weights  ? 05/15/21 0656  ?Weight: 75.8 kg  ? ? ?Examination: ?General: Otherwise healthy-appearing white male currently sedated on mechanical ventilation post CABG    ?HENT: Normocephalic atraumatic no jugular venous distention appreciated, he is orally intubated ?Lungs: Clear diminished bases no accessory use currently on full ventilator support ?Cardiovascular: Regular rate and rhythm midsternal dressing intact mediastinal tubes with expected bloody output ?Abdomen: Soft not tender ?Extremities: Left lower extremity wrapped in Ace bandage CMS appears intact ?Neuro: Sedated ?GU: Foley catheter in place ? ?Resolved Hospital Problem list   ? ? ?Assessment & Plan:  ?Principal Problem: ?  S/P CABG x 4 ?Active Problems: ?  Mixed hyperlipidemia ?  GERD (gastroesophageal reflux disease) ?  Coronary artery disease of native artery of native heart with stable angina pectoris  ([place]) ?  History of right bundle branch block (RBBB) ?  Grade I diastolic dysfunction ?  Aortic regurgitation ? ?S/p CABG x4 in setting of known 3 V CAD ?Plan ?Wean pressors and Inotropes per protocol ?Tubes/lines per CVTS ?F/u post-op labs ? ?H/o right BBB ?Plan ?Cont tele  ? ?H/o HL ?Plan ?Crestor ? ?Vent weaning ?Plan ?PAD protocol ?Rapid wean  ?VAP bundle  ? ?H/o GERD ?Plan ?PPI ? ?Best Practice (right click and "Reselect all SmartList Selections" daily)  ? ?Diet/type: NPO ?DVT prophylaxis: SCD ?GI prophylaxis: PPI ?Lines: Central line ?Foley:  Yes, and it is still needed ?Code Status:  full code ?Last date of multidisciplinary goals of care discussion [pending and per primary ] ? ?Labs   ?CBC: ?Recent Labs  ?Lab 05/13/21 ?1130 05/15/21 ?0923 05/15/21 ?1114  ?WBC 8.7  --   --   ?HGB 15.3 13.3 11.6*  ?HCT 44.9 39.0 34.0*  ?MCV 87.5  --   --   ?PLT 145*  --   --   ? ? ?Basic Metabolic Panel: ?Recent Labs  ?Lab 05/13/21 ?1130 05/15/21 ?0923 05/15/21 ?1114  ?NA 139 139 140  ?K 4.4 4.1 4.0  ?CL 109 102 106  ?CO2 22  --   --   ?GLUCOSE 93 132* 94  ?BUN '13 15 13  '$ ?CREATININE 0.74 0.70 0.60*  ?CALCIUM 9.0  --   --   ? ?GFR: ?Estimated Creatinine Clearance: 78.6 mL/min (A) (by C-G formula based on SCr of 0.6 mg/dL (L)). ?Recent Labs  ?  Lab 05/13/21 ?1130  ?WBC 8.7  ? ? ?Liver Function Tests: ?Recent Labs  ?Lab 05/13/21 ?1130  ?AST 25  ?ALT 19  ?ALKPHOS 81  ?BILITOT 0.5  ?PROT 6.7  ?ALBUMIN 3.9  ? ?No results for input(s): LIPASE, AMYLASE in the last 168 hours. ?No results for input(s): AMMONIA in the last 168 hours. ? ?ABG ?   ?Component Value Date/Time  ? PHART 7.42 05/13/2021 1131  ? PCO2ART 40 05/13/2021 1131  ? PO2ART 115 (H) 05/13/2021 1131  ? HCO3 25.9 05/13/2021 1131  ? TCO2 25 05/15/2021 1114  ? O2SAT 99.3 05/13/2021 1131  ?  ? ?Coagulation Profile: ?Recent Labs  ?Lab 05/13/21 ?1130  ?INR 0.9  ? ? ?Cardiac Enzymes: ?No results for input(s): CKTOTAL, CKMB, CKMBINDEX, TROPONINI in the last 168 hours. ? ?HbA1C: ?Hgb  A1c MFr Bld  ?Date/Time Value Ref Range Status  ?05/13/2021 11:30 AM 5.8 (H) 4.8 - 5.6 % Final  ?  Comment:  ?  (NOTE) ?Pre diabetes:          5.7%-6.4% ? ?Diabetes:              >6.4% ? ?Glycemic control for   <7.0% ?adults with diabetes ?  ? ? ?CBG: ?No results for input(s): GLUCAP in the last 168 hours. ? ?Review of Systems:   ?Not able  ? ?Past Medical History:  ?He,  has a past medical history of GERD (gastroesophageal reflux disease), Headache, and Mixed hyperlipidemia.  ? ?Surgical History:  ? ?Past Surgical History:  ?Procedure Laterality Date  ? COLONOSCOPY  08/2016  ? CYSTECTOMY  04/2021  ? Upper right quadrant of chest  ? LEFT HEART CATH AND CORONARY ANGIOGRAPHY N/A 05/08/2021  ? Procedure: LEFT HEART CATH AND CORONARY ANGIOGRAPHY;  Surgeon: Burnell Blanks, MD;  Location: [facility];  Service: Cardiovascular;  Laterality: N/A;  ? ROTATOR CUFF REPAIR Right 02/2015  ?  ? ?Social History:  ? reports that he has never smoked. He has never been exposed to tobacco smoke. He has never used smokeless tobacco. He reports that he does not currently use alcohol. He reports that he does not use drugs.  ? ?Family History:  ?His family history includes Cancer in his mother; Diabetes in his brother; Heart attack in his father; Heart disease in his brother.  ? ?Allergies ?Allergies  ?Allergen Reactions  ? Oxycodone   ?  Other reaction(s): Other (See Comments) ?"made me feel funny"  ? Statins Other (See Comments)  ?  Myalgia  ?  ? ?Home Medications  ?Prior to Admission medications   ?Medication Sig Start Date End Date Taking? Authorizing Provider  ?aspirin EC 81 MG tablet Take 1 tablet (81 mg total) by mouth daily. Swallow whole. 05/03/21  Yes Richardo Priest, MD  ?Coenzyme Q10 100 MG capsule Take 100 mg by mouth daily.   Yes [provider]  ?metoprolol succinate (TOPROL XL) 25 MG 24 hr tablet Take 0.5 tablets (12.5 mg total) by mouth daily. 05/03/21  Yes Richardo Priest, MD  ?Multiple Vitamin  (ONE-DAILY MULTI-VITAMIN PO) Take 1 tablet by mouth daily.   Yes [provider]  ?naproxen sodium (ALEVE) 220 MG tablet Take 440 mg by mouth daily as needed (pain).   Yes [provider]  ?nitroGLYCERIN (NITROSTAT) 0.4 MG SL tablet Place 1 tablet (0.4 mg total) under the tongue every 5 (five) minutes as needed for chest pain. 05/03/21  Yes Richardo Priest, MD  ?omeprazole (PRILOSEC) 20 MG capsule  Take 20 mg by mouth daily.   Yes [provider]  ?rosuvastatin (CRESTOR) 20 MG tablet Take 1 tablet (20 mg total) by mouth daily. 04/17/21 07/16/21 Yes Richardo Priest, MD  ?triamcinolone cream (KENALOG) 0.1 % Apply 1 application. topically 2 (two) times daily.   Yes [provider]  ?  ? ?Critical care time: 32 min  ?  ?Erick Colace ACNP-BC ?[place] ?Pager # [phone] OR # [phone] if no answer ? ? ? ? ? ?

## 2021-05-15 NOTE — Progress Notes (Signed)
Patient's BP 136/68 and pulse 71. Dr. Nyoka Cowden with anesthesia made aware patient took Metoprolol (Toprol XL) 25 mg 24 hour tablet yesterday at 2 pm. Per Dr. Nyoka Cowden it is ok to give Metoprolol 12.5 mg as ordered.  ?

## 2021-05-15 NOTE — Procedures (Signed)
Extubation Procedure Note ? ?Patient Details:   ?Name: Derek Wood ?DOB: 1945/08/24 ?MRN: [id number] ?  ?Airway Documentation:  ?  ?Vent end date: 05/15/21 Vent end time: 2128  ? ?Evaluation ? O2 sats: stable throughout ?Complications: No apparent complications ?Patient did tolerate procedure well. ?Bilateral Breath Sounds: Diminished ?  ?Yes ? ?Patient performed a NIF -28 and VC 1 L.  Patient extubated to a 4L [place] ; performed IS 750 mL.  ? ?[name] ?05/15/2021, 9:32 PM ? ?

## 2021-05-15 NOTE — Anesthesia Procedure Notes (Signed)
Procedure Name: Intubation ?Date/Time: 05/15/2021 9:00 AM ?Performed by: Lowella Dell, CRNA ?Pre-anesthesia Checklist: Patient identified, Emergency Drugs available, Suction available and Patient being monitored ?Patient Re-evaluated:Patient Re-evaluated prior to induction ?Oxygen Delivery Method: Circle System Utilized ?Preoxygenation: Pre-oxygenation with 100% oxygen ?Induction Type: IV induction ?Ventilation: Mask ventilation without difficulty ?Laryngoscope Size: Mac and 4 ?Grade View: Grade I ?Tube type: Oral ?Tube size: 8.0 mm ?Number of attempts: 1 ?Airway Equipment and Method: Stylet and Oral airway ?Placement Confirmation: ETT inserted through vocal cords under direct vision, positive ETCO2 and breath sounds checked- equal and bilateral ?Secured at: 23 cm ?Tube secured with: Tape ?Dental Injury: Teeth and Oropharynx as per pre-operative assessment  ? ? ? ? ?

## 2021-05-15 NOTE — Brief Op Note (Signed)
05/15/2021 ? ?2:21 PM ? ?PATIENT:  Derek Wood  76 y.o. male ? ?PRE-OPERATIVE DIAGNOSIS:  CAD ? ?POST-OPERATIVE DIAGNOSIS:  CAD ? ?PROCEDURE:  Procedure(s): ?CORONARY ARTERY BYPASS GRAFTING (CABG) TIMES THREE   USING LEFT INTERNAL MAMMARY ARTERY AND RIGHT AND LEFT SAPHENOUS VEIN (N/A) ?TRANSESOPHAGEAL ECHOCARDIOGRAM (TEE) (N/A) ?Vein harvest time: 57mn Vein prep time: 292m ? ?SURGEON:  Surgeon(s) and Role: ?   * LiLajuana MatteMD - Primary ? ?PHYSICIAN ASSISTANT: Taron Mondor PA-C ? ?ASSISTANTS: RNFA  ? ?ANESTHESIA:   general ? ?EBL:  569 mL  ? ?BLOOD ADMINISTERED:none ? ?DRAINS:  RIGHT AND LEFT PLEURAL, MEDIASTINAL   ? ?LOCAL MEDICATIONS USED:  NONE ? ?SPECIMEN:  No Specimen ? ?DISPOSITION OF SPECIMEN:  N/A ? ?COUNTS:  YES ? ?TOURNIQUET:  * No tourniquets in log * ? ?DICTATION: .Dragon Dictation ? ?PLAN OF CARE: Admit to inpatient  ? ?PATIENT DISPOSITION:  ICU - intubated and hemodynamically stable. ?  ?Delay start of Pharmacological VTE agent (>24hrs) due to surgical blood loss or risk of bleeding: yes ? ?COMPLICATIONS: NO KNOWN ? ?

## 2021-05-15 NOTE — Progress Notes (Signed)
?  Echocardiogram ?Echocardiogram Transesophageal has been performed. ? ?Derek Wood ?05/15/2021, 9:55 AM ?

## 2021-05-15 NOTE — Op Note (Signed)
? ?   ?[address] ?      [place] [address] ?            [phone]      ?                                 ?  ? ?05/15/2021 ?Patient:  Derek Wood ?Pre-Op Dx: 3 V CAD ?Stable angina ?HTN ?HLP   ?Post-op Dx:  same ?Procedure: ?CABG X 3.  LIMA LAD, RSVG PDA, OM1   ?Open greater saphenous vein harvest on the right and left ? ? ?Surgeon and Role:   ?   * Lajuana Matte, MD - Primary ?   Evonnie Pat, PA-C - assisting ?An experienced assistant was required given the complexity of this surgery and the standard of surgical care. The assistant was needed for exposure, dissection, suctioning, retraction of delicate tissues and sutures, instrument exchange and for overall help during this procedure.   ? ?Anesthesia  general ?EBL:  513m ?Blood Administration: none ?Xclamp Time:  52 min ?Pump Time:  851m ? ?Drains: 1962 blake drain:  R, L, mediastinal  ?Wires: ventricular ?Counts: correct ? ? ?Indications: ?[age]ear old male presents with three-vessel coronary artery disease, and mild to moderate aortic valve regurgitation.  Minimally symptomatic however review of his left heart catheterization he does have significant disease including a 70% left main stenosis.  He would like to proceed with surgical revascularization.  The risks and benefits and details of the procedure were discussed.  In regards to his aortic valve it is more on the mild side on my read.  Given his age, I do not have any plans to replace the valve at this point.   ? ?Findings: ?Good LIMA.  Small vein.  Calcified LAD, small PDA, good OM ? ?Operative Technique: ?All invasive lines were placed in pre-op holding.  After the risks, benefits and alternatives were thoroughly discussed, the patient was brought to the operative theatre.  Anesthesia was induced, and the patient was prepped and draped in normal sterile fashion.  An appropriate surgical pause was performed, and pre-operative antibiotics were dosed accordingly. ? ?We began with  simultaneous incisions along the right leg for harvesting of the greater saphenous vein and the chest for the sternotomy.  In regards to the sternotomy, this was carried down with bovie cautery, and the sternum was divided with a reciprocating saw.  Meticulous hemostasis was obtained.  The left internal thoracic artery was exposed and harvested in in pedicled fashion.  The patient was systemically heparinized, and the artery was divided distally, and placed in a papaverine sponge.   ? ?The sternal elevator was removed, and a retractor was placed.  The pericardium was divided in the midline and fashioned into a cradle with pericardial stitches.   After we confirmed an appropriate ACT, the ascending aorta was cannulated in standard fashion.  The right atrial appendage was used for venous cannulation site.  Cardiopulmonary bypass was initiated, and the heart retractor was placed. The cross clamp was applied, and a dose of anterograde cardioplegia was given with good arrest of the heart.  We moved to the posterior wall of the heart, and found a good target on the PDA.  An arteriotomy was made, and the vein graft was anastomosed to it in an end to side fashion.  Next we exposed the lateral wall, and found a good target  on the OM.  An end to side anastomosis with the vein graft was then created.  Finally, we exposed a good target on the LAD, and fashioned an end to side anastomosis between it and the LITA.  We began to re-warm, and a re-animation dose of cardioplegia was given.  The heart was de-aired, and the cross clamp was removed.  Meticulous hemostasis was obtained.   ? ?A partial occludding clamp was then placed on the ascending aorta, and we created an end to side anastomosis between it and the proximal vein grafts.  Rings were placed on the proximal anastomosis.  Hemostasis was obtained, and we separated from cardiopulmonary bypass without event.  The heparin was reversed with protamine.  Chest tubes and wires  were placed, and the sternum was re-approximated with sternal wires.  The soft tissue and skin were re-approximated wth absorbable suture.   ? ?The patient tolerated the procedure without any immediate complications, and was transferred to the ICU in guarded condition. ? ?Lajuana Matte ? ?

## 2021-05-16 ENCOUNTER — Encounter (HOSPITAL_COMMUNITY): Payer: Self-pay | Admitting: Thoracic Surgery (Cardiothoracic Vascular Surgery)

## 2021-05-16 ENCOUNTER — Inpatient Hospital Stay (HOSPITAL_COMMUNITY): Payer: Medicare HMO

## 2021-05-16 DIAGNOSIS — Z951 Presence of aortocoronary bypass graft: Secondary | ICD-10-CM | POA: Diagnosis not present

## 2021-05-16 LAB — BASIC METABOLIC PANEL
Anion gap: 5 (ref 5–15)
Anion gap: 7 (ref 5–15)
BUN: 16 mg/dL (ref 8–23)
BUN: 23 mg/dL (ref 8–23)
CO2: 22 mmol/L (ref 22–32)
CO2: 22 mmol/L (ref 22–32)
Calcium: 7.8 mg/dL — ABNORMAL LOW (ref 8.9–10.3)
Calcium: 7.8 mg/dL — ABNORMAL LOW (ref 8.9–10.3)
Chloride: 111 mmol/L (ref 98–111)
Chloride: 105 mmol/L (ref 98–111)
Creatinine, Ser: 0.87 mg/dL (ref 0.61–1.24)
Creatinine, Ser: 1.02 mg/dL (ref 0.61–1.24)
GFR, Estimated: >60 mL/min (ref >60)
GFR, Estimated: >60 mL/min (ref >60)
Glucose, Bld: 183 mg/dL — ABNORMAL HIGH (ref 70–99)
Glucose, Bld: 158 mg/dL — ABNORMAL HIGH (ref 70–99)
Potassium: 4.2 mmol/L (ref 3.5–5.1)
Potassium: 4.2 mmol/L (ref 3.5–5.1)
Sodium: 138 mmol/L (ref 135–145)
Sodium: 134 mmol/L — ABNORMAL LOW (ref 135–145)

## 2021-05-16 LAB — GLUCOSE, CAPILLARY
Glucose-Capillary: 118 mg/dL — ABNORMAL HIGH (ref 70–99)
Glucose-Capillary: 125 mg/dL — ABNORMAL HIGH (ref 70–99)
Glucose-Capillary: 131 mg/dL — ABNORMAL HIGH (ref 70–99)
Glucose-Capillary: 134 mg/dL — ABNORMAL HIGH (ref 70–99)
Glucose-Capillary: 143 mg/dL — ABNORMAL HIGH (ref 70–99)
Glucose-Capillary: 144 mg/dL — ABNORMAL HIGH (ref 70–99)
Glucose-Capillary: 153 mg/dL — ABNORMAL HIGH (ref 70–99)
Glucose-Capillary: 156 mg/dL — ABNORMAL HIGH (ref 70–99)
Glucose-Capillary: 161 mg/dL — ABNORMAL HIGH (ref 70–99)
Glucose-Capillary: 165 mg/dL — ABNORMAL HIGH (ref 70–99)
Glucose-Capillary: 170 mg/dL — ABNORMAL HIGH (ref 70–99)
Glucose-Capillary: 171 mg/dL — ABNORMAL HIGH (ref 70–99)
Glucose-Capillary: 177 mg/dL — ABNORMAL HIGH (ref 70–99)
Glucose-Capillary: 180 mg/dL — ABNORMAL HIGH (ref 70–99)

## 2021-05-16 LAB — CBC
HCT: 28.2 % — ABNORMAL LOW (ref 39.0–52.0)
HCT: 23.9 % — ABNORMAL LOW (ref 39.0–52.0)
Hemoglobin: 10.2 g/dL — ABNORMAL LOW (ref 13.0–17.0)
Hemoglobin: 8.4 g/dL — ABNORMAL LOW (ref 13.0–17.0)
MCH: 30.7 pg (ref 26.0–34.0)
MCH: 30.4 pg (ref 26.0–34.0)
MCHC: 36.2 g/dL — ABNORMAL HIGH (ref 30.0–36.0)
MCHC: 35.1 g/dL (ref 30.0–36.0)
MCV: 84.9 fL (ref 80.0–100.0)
MCV: 86.6 fL (ref 80.0–100.0)
Platelets: 146 10*3/uL — ABNORMAL LOW (ref 150–400)
Platelets: 97 10*3/uL — ABNORMAL LOW (ref 150–400)
RBC: 3.32 MIL/uL — ABNORMAL LOW (ref 4.22–5.81)
RBC: 2.76 MIL/uL — ABNORMAL LOW (ref 4.22–5.81)
RDW: 11.9 % (ref 11.5–15.5)
RDW: 12.2 % (ref 11.5–15.5)
WBC: 20.6 10*3/uL — ABNORMAL HIGH (ref 4.0–10.5)
WBC: 13.9 10*3/uL — ABNORMAL HIGH (ref 4.0–10.5)
nRBC: 0 % (ref 0.0–0.2)
nRBC: 0 % (ref 0.0–0.2)

## 2021-05-16 LAB — MAGNESIUM
Magnesium: 2.4 mg/dL (ref 1.7–2.4)
Magnesium: 2.2 mg/dL (ref 1.7–2.4)

## 2021-05-16 MED ORDER — INSULIN ASPART 100 UNIT/ML IJ SOLN
0.0000 [IU] | INTRAMUSCULAR | Status: DC
  Administered 2021-05-16 (×2): 2 [IU] via SUBCUTANEOUS
  Administered 2021-05-17: 4 [IU] via SUBCUTANEOUS
  Administered 2021-05-17 – 2021-05-18 (×5): 2 [IU] via SUBCUTANEOUS

## 2021-05-16 MED ORDER — HYDROCORTISONE 1 % EX CREA
TOPICAL_CREAM | Freq: Two times a day (BID) | CUTANEOUS | Status: DC
  Filled 2021-05-16: qty 28

## 2021-05-16 MED ORDER — ORAL CARE MOUTH RINSE
15.0000 mL | Freq: Two times a day (BID) | OROMUCOSAL | Status: DC
  Administered 2021-05-16 – 2021-05-19 (×3): 15 mL via OROMUCOSAL

## 2021-05-16 MED ORDER — ALBUMIN HUMAN 5 % IV SOLN: 12.5000 g | Freq: Once | INTRAVENOUS | Status: AC

## 2021-05-16 MED ORDER — ALBUMIN HUMAN 5 % IV SOLN
INTRAVENOUS | Status: AC
  Administered 2021-05-16: 12.5 g via INTRAVENOUS
  Filled 2021-05-16: qty 250

## 2021-05-16 NOTE — Progress Notes (Addendum)
TCTS DAILY ICU PROGRESS NOTE ? ?                 [address] ?           [place] [address] ?         [phone]  ? ?1 Day Post-Op ?Procedure(s) (LRB): ?CORONARY ARTERY BYPASS GRAFTING (CABG) TIMES THREE   USING LEFT INTERNAL MAMMARY ARTERY AND RIGHT AND LEFT SAPHENOUS VEIN (N/A) ?TRANSESOPHAGEAL ECHOCARDIOGRAM (TEE) (N/A) ? ?Total Length of Stay:  LOS: 1 day  ? ?Subjective: ?Alert, feels pretty well ? ?Objective: ?Vital signs in last 24 hours: ?Temp:  [96.3 ?F (35.7 ?C)-99.9 ?F (37.7 ?C)] 99.7 ?F (37.6 ?C) (05/11 0700) ?Pulse Rate:  [65-132] 114 (05/11 0700) ?Cardiac Rhythm: Sinus tachycardia;Normal sinus rhythm (05/11 0400) ?Resp:  [11-34] 21 (05/11 0700) ?BP: (91-138)/(50-70) 121/63 (05/11 0700) ?SpO2:  [97 %-100 %] 100 % (05/11 0700) ?Arterial Line BP: (93-157)/(41-63) 154/44 (05/11 0700) ?FiO2 (%):  [40 %-50 %] 40 % (05/10 2100) ?Weight:  [75.3 kg] 75.3 kg (05/11 0500) ? ?Filed Weights  ? 05/15/21 0656 05/16/21 0500  ?Weight: 75.8 kg 75.3 kg  ? ? ?Weight change: -0.451 kg  ? ?Hemodynamic parameters for last 24 hours: ?CVP:  [0 mmHg-9 mmHg] 0 mmHg ? ?Intake/Output from previous day: ?05/10 0701 - 05/11 0700 ?In: 4481.8 [P.O.:40; I.V.:3600.5; Blood:370; IV Piggyback:2049.1] ?Out: 6193 [Urine:2300; Blood:569; Chest Tube:560] ? ?Intake/Output this shift: ?No intake/output data recorded. ? ?Current Meds: ?Scheduled Meds: ? acetaminophen  1,000 mg Oral Q6H  ? Or  ? acetaminophen (TYLENOL) oral liquid 160 mg/5 mL  1,000 mg Per Tube Q6H  ? aspirin EC  325 mg Oral Daily  ? Or  ? aspirin  324 mg Per Tube Daily  ? bisacodyl  10 mg Oral Daily  ? Or  ? bisacodyl  10 mg Rectal Daily  ? Chlorhexidine Gluconate Cloth  6 each Topical Daily  ? Chlorhexidine Gluconate Cloth  6 each Topical Daily  ? docusate sodium  200 mg Oral Daily  ? metoprolol tartrate  12.5 mg Oral BID  ? Or  ? metoprolol tartrate  12.5 mg Per Tube BID  ? [START ON 05/17/2021] pantoprazole  40 mg Oral Daily  ? rosuvastatin  20 mg Oral Daily   ? sodium chloride flush  10-40 mL Intracatheter Q12H  ? sodium chloride flush  3 mL Intravenous Q12H  ? ?Continuous Infusions: ? sodium chloride    ? sodium chloride 10 mL/hr at 05/16/21 0700  ? sodium chloride 10 mL/hr at 05/15/21 1813  ?  ceFAZolin (ANCEF) IV Stopped (05/16/21 0604)  ? dexmedetomidine (PRECEDEX) IV infusion 0.7 mcg/kg/hr (05/15/21 1404)  ? DOBUTamine    ? famotidine (PEPCID) IV Stopped (05/15/21 1511)  ? insulin 3.2 Units/hr (05/16/21 0700)  ? lactated ringers    ? lactated ringers    ? lactated ringers 20 mL/hr at 05/16/21 0700  ? niCARDipine    ? norepinephrine (LEVOPHED) Adult infusion 16 mcg/min (05/16/21 0700)  ? phenylephrine (NEO-SYNEPHRINE) Adult infusion Stopped (05/16/21 0225)  ? ?PRN Meds:.sodium chloride, dextrose, lactated ringers, metoprolol tartrate, midazolam, morphine injection, ondansetron (ZOFRAN) IV, oxyCODONE, sodium chloride flush, sodium chloride flush, traMADol ? ?General appearance: alert, cooperative, and no distress ?Heart: regular rate and rhythm and tachy ?Lungs: clear to auscultation bilaterally ?Abdomen: soft, non tender ?Extremities: no edema ?Wound: dressings intact, minor bloody drainage on leg incis ? ?Lab Results: ?CBC: ?Recent Labs  ?  05/15/21 ?2000 05/15/21 ?2123 05/15/21 ?2223 05/16/21 ?0404  ?  WBC 18.7*  --   --  20.6*  ?HGB 10.7*   < > 8.5* 10.2*  ?HCT 30.5*   < > 25.0* 28.2*  ?PLT 138*  --   --  146*  ? < > = values in this interval not displayed.  ? ?BMET:  ?Recent Labs  ?  05/15/21 ?2000 05/15/21 ?2123 05/15/21 ?2223 05/16/21 ?0404  ?NA 138   < > 141 138  ?K 4.6   < > 3.9 4.2  ?CL 110  --   --  111  ?CO2 22  --   --  22  ?GLUCOSE 143*  --   --  183*  ?BUN 14  --   --  16  ?CREATININE 0.86  --   --  0.87  ?CALCIUM 7.5*  --   --  7.8*  ? < > = values in this interval not displayed.  ?  ?CMET: ?Lab Results  ?Component Value Date  ? WBC 20.6 (H) 05/16/2021  ? HGB 10.2 (L) 05/16/2021  ? HCT 28.2 (L) 05/16/2021  ? PLT 146 (L) 05/16/2021  ? GLUCOSE 183 (H)  05/16/2021  ? CHOL 253 (H) 04/15/2021  ? TRIG 130 04/15/2021  ? HDL 46 04/15/2021  ? LDLCALC 183 (H) 04/15/2021  ? ALT 19 05/13/2021  ? AST 25 05/13/2021  ? NA 138 05/16/2021  ? K 4.2 05/16/2021  ? CL 111 05/16/2021  ? CREATININE 0.87 05/16/2021  ? BUN 16 05/16/2021  ? CO2 22 05/16/2021  ? INR 1.4 (H) 05/15/2021  ? HGBA1C 5.8 (H) 05/13/2021  ? ? ? ? ?PT/INR:  ?Recent Labs  ?  05/15/21 ?1427  ?LABPROT 16.6*  ?INR 1.4*  ? ?Radiology: [facility] Chest Port 1 View ? ?Result Date: 05/15/2021 ?CLINICAL DATA:  Status post CABG x3. EXAM: PORTABLE CHEST 1 VIEW COMPARISON:  Yesterday 05/13/2021 FINDINGS: Interval median sternotomy for CABG. Endotracheal tube terminates 6.2 cm above carina. Nasogastric tube extends beyond the inferior aspect of the film. Right internal jugular line tip at low SVC. Right chest tube and mediastinal drain versus a second right chest tube identified. Midline trachea. Borderline cardiomegaly. No pleural effusion or pneumothorax. No congestive failure. Mild bibasilar atelectasis. IMPRESSION: Appropriate position of support apparatus, as detailed above. Slightly worsened bibasilar aeration with developing left greater than right base atelectasis. Electronically Signed   By: Abigail Miyamoto M.D.   On: 05/15/2021 14:34  ? ?ECHO INTRAOPERATIVE TEE ? ?Result Date: 05/15/2021 ? *INTRAOPERATIVE TRANSESOPHAGEAL REPORT *  Patient Name:   Derek Wood   Date of Exam: 05/15/2021 Medical Rec #:  [id number]      Height:       70.0 in Accession #:    [id number]     Weight:       167.0 lb Date of Birth:  [birth year]      BSA:          1.93 m? Patient Age:    76 years       BP:           121/70 mmHg Patient Gender: M              HR:           58 bpm. Exam Location:  Anesthesiology Transesophogeal exam was perform intraoperatively during surgical procedure. Patient was closely monitored under general anesthesia during the entirety of examination. Indications:     Coronary Artery Disease Sonographer:     Bernadene Person RDCS  Performing Phys: [id number] [facility]  Diagnosing Phys: Belinda Block MD POST-OP IMPRESSIONS _ Left Ventricle: Post Bypass: The patient came off bypass on the first attempt. Left ventricular contraction did improve with volume replacement. There did not appear to be any new findings from preop exam. The TEE that had been placed after induction uneventfully was removed at the end of the case without difficulty. The patient was later taken to the SICU in stable condition. PRE-OP FINDINGS  Left Ventricle: The left ventricle has low normal systolic function, with an ejection fraction of 50-55%. Right Ventricle: The right ventricle has normal systolic function. Left Atrium: Left atrial size was normal in size. No left atrial/left atrial appendage thrombus was detected. Mitral Valve: Mitral valve regurgitation is mild by color flow Doppler. Tricuspid Valve: The tricuspid valve was normal in structure. Aortic Valve: The aortic valve is tricuspid Aortic valve regurgitation is moderate by color flow Doppler. Pulmonic Valve: The pulmonic valve was normal in structure.   Belinda Block MD Electronically signed by Belinda Block MD Signature Date/Time: 05/15/2021/4:07:02 PM   Final    ? ? ?Assessment/Plan: ?S/P Procedure(s) (LRB): ?CORONARY ARTERY BYPASS GRAFTING (CABG) TIMES THREE   USING LEFT INTERNAL MAMMARY ARTERY AND RIGHT AND LEFT SAPHENOUS VEIN (N/A) ?TRANSESOPHAGEAL ECHOCARDIOGRAM (TEE) (N/A) ?POD#1 ? ?1 Tmax 99.7, VSS, sinus tachy in 120-140's, CO/CI good, SVR low- I think he needs volume and better rate control so hopefully weaning levo will help- getting LR may need albumin as well,  Wean for SBP>120 ?2 sats good on 2 liters.  ?3 good UOP, normal renal fxn ?4 CT 560- leave for now ?5 CBG 150's-180's, not a diabetic ?6 leukocytosis , trending higher, WBC 20.6- hopefully just reactive ?7 expected ABLA , equilibrating, monitor, not in transfusion threshold soon ?8 CXR, minor atx- routine pulm  hygiene/rehab ? ? ? ? ? ? ?John Giovanni PA-C ?Pager [phone] ?05/16/2021 7:33 AM  ? ? ?Doing well ?Low diastolic, will wean levo off systolic >353 ?Ambulation today ?Will keep Cts ? ?Derek Wood ? ?

## 2021-05-16 NOTE — Progress Notes (Signed)
? ?  NAME:  Derek Wood, MRN:  [id number], DOB:  January 13, 1945, LOS: 1 ?ADMISSION DATE:  05/15/2021, CONSULTATION DATE:  5/10 ?REFERRING MD:  Kipp Brood, CHIEF COMPLAINT:  post-op critical care   ? ?History of Present Illness:  ? ?76 year old male w/ 3V CAD and PMH as outlined below. Went to OR 5/10 for elective 4V CABG.  ?PCCM asked to assist w/ post-op management  ?Pertinent  Medical History  ?3 V CAD w/ mod AVR (with strong family dx of heart disease); baseline ECHO 60-65% no RWM abnormality; grade I diastolic dysfxn ?Baseline RBBB ?GERD ?HL ? ?Significant Hospital Events: ?Including procedures, antibiotic start and stop dates in addition to other pertinent events   ?5/10 4 V CABG  ? ?Interim History / Subjective:  ?Extubated fine. ?Some dizziness. ?Very wide pulse pressure attributable to AI per primary. ? ?Objective   ?Blood pressure (!) 106/35, pulse (!) 107, temperature 99.3 ?F (37.4 ?C), resp. rate (!) 21, height '5\' 10"'$  (1.778 m), weight 75.3 kg, SpO2 94 %. ?CVP:  [0 mmHg-9 mmHg] 5 mmHg  ?Vent Mode: CPAP;PSV ?FiO2 (%):  [40 %-50 %] 40 % ?Set Rate:  [4 bmp-12 bmp] 4 bmp ?Vt Set:  [580 mL] 580 mL ?PEEP:  [5 cmH20] 5 cmH20 ?Pressure Support:  [10 cmH20] 10 cmH20 ?Plateau Pressure:  [16 cmH20] 16 cmH20  ? ?Intake/Output Summary (Last 24 hours) at 05/16/2021 1248 ?Last data filed at 05/16/2021 1200 ?Gross per 24 hour  ?Intake 5721.93 ml  ?Output 5643 ml  ?Net 78.93 ml  ? ? ?Filed Weights  ? 05/15/21 0656 05/16/21 0500  ?Weight: 75.8 kg 75.3 kg  ? ? ?Examination: ?No distress. ?Lungs clear ?Ext warm ?Sternotomy site dressed without strikethrough ?Small bloody output from mediastinal drains ? ?Review of CXR shows the line that I thought was diaphragm is just left chest tube.  Minimal LLL atelectasis. ? ?Resolved Hospital Problem list   ? ? ?Assessment & Plan:  ?Principal Problem: ?  S/P CABG x 4 ?Active Problems: ?  Mixed hyperlipidemia ?  GERD (gastroesophageal reflux disease) ?  Coronary artery disease of native artery of  native heart with stable angina pectoris ([place]) ?  History of right bundle branch block (RBBB) ?  Grade I diastolic dysfunction ?  Aortic regurgitation ?  S/P CABG x 3 ? ?S/p CABG x4 in setting of known 3 V CAD ?Some ongoing low MAPs driven by low diastolic pressures suspected due to AI ?Plan ?Wean pressors for systolic 937 per Dr. Kipp Brood ?Usual postop bundle ? ?H/o HL ?Plan ?Crestor ? ? ?H/o GERD ?Plan ?PPI ? ?Will follow while in ICU ?Plan d/w RN and Dr. Kipp Brood ? ?Erskine Emery MD PCCM ?

## 2021-05-16 NOTE — Progress Notes (Signed)
New order from Dr. Kipp Brood this am to titrate pressors to systolic goal > 169 and not to take the MAP into account due to aortic insufficiency and low diastolic pressures. Weaned levophed off to systolic IHWT>888. UOP has dropped to only 10 mL per hour.  Notified Dr. Kipp Brood and no new orders at this time.  ?

## 2021-05-16 NOTE — Progress Notes (Signed)
Patient ID: Derek Wood, male   DOB: 1945/10/08, 76 y.o.   MRN: [id number] ?TCTS Evening Rounds: ? ?Hemodynamically stable off NE with some volume today. Rhythm is sinus tach 120. ? ?Sats 95% on RA ? ?UO 20/hr ? ?Chest tube output low.  ?

## 2021-05-16 NOTE — Discharge Summary (Signed)
[address]       [place] [address]             [phone]    Physician Discharge Summary  Patient ID: Derek Wood MRN: [id number] DOB/AGE: 78/16/1947 78 y.o.  Admit date: 05/15/2021 Discharge date: 05/21/2021  Admission Diagnoses:  Patient Active Problem List   Diagnosis Date Noted   S/P CABG x 4 05/15/2021   History of right bundle branch block (RBBB) 05/15/2021   Grade I diastolic dysfunction 60/63/0160   Aortic regurgitation 05/15/2021   S/P CABG x 3 05/15/2021   Coronary artery disease of native artery of native heart with stable angina pectoris ([place])    Mixed hyperlipidemia 04/12/2021   GERD (gastroesophageal reflux disease) 04/12/2021     Discharge Diagnoses:  Patient Active Problem List   Diagnosis Date Noted   S/P CABG x 4 05/15/2021   History of right bundle branch block (RBBB) 05/15/2021   Grade I diastolic dysfunction 10/93/2355   Aortic regurgitation 05/15/2021   S/P CABG x 3 05/15/2021   Coronary artery disease of native artery of native heart with stable angina pectoris ([place])    Mixed hyperlipidemia 04/12/2021   GERD (gastroesophageal reflux disease) 04/12/2021     Discharged Condition: good  History of Present Illness:     76 year old male presents for surgical evaluation of the coronary artery disease.  He does have a strong family history of coronary disease and that his brother and father both died from heart attacks.  He denies any anginal symptoms, but on deeper probing does admit to some exertional shortness of breath.  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  His cardiac catheterization revealed significant disease including 70% left main stenosis.  Dr. Kipp Brood evaluated the patient and studies and recommended proceeding with surgical coronary artery revascularization.  He was admitted this hospitalization for the procedure.  Hospital course:  The  patient was taken the operating room on 05/15/2021 at which time he underwent CABG x3.  He tolerated the procedure well was taken to the surgical intensive care unit in stable condition.  Postoperative hospital course:  The patient was extubated without difficulty using standard post cardiac surgical protocols.  He did initially require some inotropic support with Levophed.  This was weaned off by postoperative day #1.  Chest tube output on postoperative day #1 was moderate and therefore tubes were left in place.  He does have a expected acute blood loss anemia which is being monitored clinically.  He has been started on iron supplementation.  It is not at the transfusion threshold.  He has maintained good urine output with normal renal function.  He is started on a course of routine post cardiac rehab phase 1 modalities.  Oxygen is being weaned using standard post op incentive spirometry and pulmonary hygiene maneuvers.  On postoperative day #2 he developed atrial fibrillation with an RVR and has been subsequently started on intravenous amiodarone.  Chest tubes were removed on postoperative day #2 as drainage had significantly decreased. He converted to sinus rhythm and was transitioned to oral Amiodarone. He did have nausea post op. He was given Zofran and a laxative. He had a bowel movement and nausea did resolve. He was felt surgically stable for transfer from the ICU to 4E for further convalescence on 05/14. He is ambulating on room air with good oxygenation. All wounds are clean, dry,  healing without signs of infection.  At the time of discharge the patient is felt to be quite stable.  Consults: PCCM while in SICU  Significant Diagnostic Studies:  DG Chest 2 View  Result Date: 05/14/2021 CLINICAL DATA:  Preoperative exam for bypass surgery. EXAM: CHEST - 2 VIEW COMPARISON:  04/30/2021. FINDINGS: The heart size and mediastinal contours are within normal limits. No consolidation, effusion, or  pneumothorax. There is evidence of prior rotator cuff repair on the right. Degenerative changes are present in the thoracic spine. No acute osseous abnormality. IMPRESSION: No active cardiopulmonary disease. Electronically Signed   By: Brett Fairy M.D.   On: 05/14/2021 04:14   CARDIAC CATHETERIZATION  Result Date: 05/08/2021   Prox RCA lesion is 80% stenosed.   Prox RCA to Mid RCA lesion is 50% stenosed.   Dist RCA lesion is 70% stenosed.   2nd Mrg lesion is 70% stenosed.   Ost LM to Mid LM lesion is 70% stenosed.   Ost LAD to Prox LAD lesion is 80% stenosed.   Mid LAD-2 lesion is 90% stenosed.   Mid LAD-1 lesion is 90% stenosed.   2nd Diag lesion is 70% stenosed.   The left ventricular systolic function is normal.   LV end diastolic pressure is normal.   The left ventricular ejection fraction is greater than 65% by visual estimate. Severe left main and triple vessel CAD 70% diffuse left main stenosis Severe, calcific disease in the proximal and mid LAD The Circumflex gives off a large obtuse marginal branch. The obtuse marginal branch has a severe stenosis. The RCA is a dominant vessel with severe proximal and severe distal stenosis. Normal LV systolic function Normal LVEDP Recommendations: He has severe triple vessel CAD involving the left main artery. I think the best revascularization strategy would be CABG. He is very functional and only has angina with moderate to heavy exertion. I will discharge him home today after an echo and consult CT surgery as an outpatient for CABG.   CT CORONARY MORPH W/CTA COR W/SCORE W/CA W/CM &/OR WO/CM  Addendum Date: 04/30/2021   ADDENDUM REPORT: 04/30/2021 12:02 CLINICAL DATA:  76 year old with chest pain EXAM: Cardiac/Coronary  CTA TECHNIQUE: The patient was scanned on a [employer]. FINDINGS: A 120 kV prospective scan was triggered in the descending thoracic aorta at 111 HU's. Axial non-contrast 3 mm slices were carried out through the heart. The data set  was analyzed on a dedicated work station and scored using the Edmund. Gantry rotation speed was 250 msecs and collimation was .6 mm. 0.8 mg of sl NTG was given. The 3D data set was reconstructed in 5% intervals of the 67-82 % of the R-R cycle. Diastolic phases were analyzed on a dedicated work station using MPR, MIP and VRT modes. The patient received 80 cc of contrast. Image quality: good Aorta:  Normal size.  Aortic atherosclerosis.  No dissection. Aortic Valve:  Trileaflet.  No calcifications. Coronary Arteries:  Normal coronary origin.  Right dominance. RCA is a large dominant artery that gives rise to PDA and PLA. There is diffuse calcified plaque, high proximal severe stenosis (0.63 FFR) with low attenuation plaque. Left main is a large artery that gives rise to LAD and LCX arteries. LAD is a large vessel that has diffuse calcified plaque, severe proximal stenosis (FFR 0.57). LCX is a non-dominant artery that gives rise to one large OM1 branch. There is diffuse calcified plaque, (FFR 0.67) in mid region consistent with severe  stenosis. Other findings: Normal pulmonary vein drainage into the left atrium. Normal left atrial appendage without a thrombus. Normal size of the pulmonary artery. Please see radiology report for non cardiac findings. IMPRESSION: 1. Unable to calculate calcium score due technical issue. Appears severely elevated. 2. Normal coronary origin with right dominance. 3. Severe multivessel CAD with FFR abnormality. Recommend cardiac catheterization. Electronically Signed   By: Candee Furbish M.D.   On: 04/30/2021 12:02   Result Date: 04/30/2021 EXAM: OVER-READ INTERPRETATION  CT CHEST The following report is an over-read performed by radiologist Dr. Abigail Miyamoto of [facility], [place] on 04/30/2021. This over-read does not include interpretation of cardiac or coronary anatomy or pathology. The coronary CTA interpretation by the cardiologist is attached. COMPARISON:  None. FINDINGS:  Vascular: Aortic atherosclerosis. No central pulmonary embolism, on this non-dedicated study. Mediastinum/Nodes: No imaged thoracic adenopathy. Mild esophageal dilatation with fluid level within on 04/10. Lungs/Pleura: No pleural fluid.  Clear imaged lungs. Upper Abdomen: Normal imaged portions of the liver, spleen, stomach. Musculoskeletal: No acute osseous abnormality. IMPRESSION: No acute findings in the imaged extracardiac chest. Mild esophageal dilatation. Air fluid level suggests dysmotility or gastroesophageal reflux. Aortic Atherosclerosis (ICD10-I70.0). Electronically Signed: By: Abigail Miyamoto M.D. On: 04/30/2021 09:08   DG Chest Port 1 View  Result Date: 05/19/2021 CLINICAL DATA:  CABG. EXAM: PORTABLE CHEST 1 VIEW COMPARISON:  05/17/2021 FINDINGS: 0536 hours. Interval removal right IJ central line. Possible drain over the medial right hemithorax on the prior study is no longer evident. Cardiopericardial silhouette is at upper limits of normal for size. There is some minimal atelectasis at the left base, decreased in the interval. Right lung clear. No substantial pleural effusion. Telemetry leads overlie the chest. IMPRESSION: Interval decrease in left base atelectasis. No pleural effusion or pneumothorax. Electronically Signed   By: Misty Stanley M.D.   On: 05/19/2021 08:55   DG Chest Port 1 View  Result Date: 05/17/2021 CLINICAL DATA:  Pneumothorax EXAM: PORTABLE CHEST 1 VIEW COMPARISON:  Chest x-ray dated May 16, 2021 FINDINGS: Cardiac and mediastinal contours are unchanged post median sternotomy and CABG. Unchanged position of right IJ line and bilateral chest tubes. Mild left basilar opacities, likely due to atelectasis. No large pleural effusion or of pneumothorax. IMPRESSION: 1. Stable support devices. 2. Stable left basilar atelectasis. Electronically Signed   By: Yetta Glassman M.D.   On: 05/17/2021 08:00   DG Chest Port 1 View  Result Date: 05/16/2021 CLINICAL DATA:  Status post CABG  x3 yesterday, chest pain without shortness of breath, EXAM: PORTABLE CHEST 1 VIEW COMPARISON:  Chest x-ray from yesterday FINDINGS: Again seen are sternotomy wires and mediastinal clips of CABG. There has been interval removal of the endotracheal and enteric tubes. Right transjugular catheter with its tip at the lower SVC region. Stable right-sided chest tube and mediastinal tube. Cardiomediastinal silhouette has a normal appearance. No pleural effusion or pneumothorax. Mild atelectasis at the left lung base without significant interval change. No vascular congestion. IMPRESSION: Post CABG changes. Mild atelectasis at the left lung base without significant interval change. Interval removal of the ET and enteric tubes. Other supporting tubes as above. Electronically Signed   By: Frazier Richards M.D.   On: 05/16/2021 08:53   DG Chest Port 1 View  Result Date: 05/15/2021 CLINICAL DATA:  Status post CABG x3. EXAM: PORTABLE CHEST 1 VIEW COMPARISON:  Yesterday 05/13/2021 FINDINGS: Interval median sternotomy for CABG. Endotracheal tube terminates 6.2 cm above carina. Nasogastric tube extends beyond the inferior  aspect of the film. Right internal jugular line tip at low SVC. Right chest tube and mediastinal drain versus a second right chest tube identified. Midline trachea. Borderline cardiomegaly. No pleural effusion or pneumothorax. No congestive failure. Mild bibasilar atelectasis. IMPRESSION: Appropriate position of support apparatus, as detailed above. Slightly worsened bibasilar aeration with developing left greater than right base atelectasis. Electronically Signed   By: Abigail Miyamoto M.D.   On: 05/15/2021 14:34   CT CORONARY FRACTIONAL FLOW RESERVE DATA PREP  Result Date: 04/30/2021 EXAM: FFRCT ANALYSIS FINDINGS: FFRct analysis was performed on the original cardiac CT angiogram dataset. Diagrammatic representation of the FFRct analysis is provided in a separate PDF document in PACS. This dictation was created  using the PDF document and an interactive 3D model of the results. 3D model is not available in the EMR/PACS. Normal FFR range is >0.80. 1. Left Main: 0.99 proximal, 0.87 distal 2. LAD: 0.75 proximal, 0.54 mid, <0.5 distal 3. LCX:0.87 proximal, 0.81 mid, 0.67 distal 4. Ramus: Too small caliber to calculate 5. RCA: Proximal 0.64, mid 0.54, distal <0.5 IMPRESSION: Severe multivessel flow limiting disease. Recommend cardiac catheterization. Note: These examples are not recommendations of [employer] and only provided as examples of what other customers are doing. Electronically Signed   By: Candee Furbish M.D.   On: 04/30/2021 16:49   ECHOCARDIOGRAM COMPLETE  Result Date: 05/08/2021    ECHOCARDIOGRAM REPORT   Patient Name:   [facility] Date of Exam: 05/08/2021 Medical Rec #:  [id number]    Height:       70.0 in Accession #:    [id number]   Weight:       164.0 lb Date of Birth:  Oct 07, 1945    BSA:          1.918 m Patient Age:    70 years     BP:           132/55 mmHg Patient Gender: M            HR:           65 bpm. Exam Location:  Inpatient Procedure: 2D Echo, Cardiac Doppler and Color Doppler Indications:    Preop  History:        Patient has no prior history of Echocardiogram examinations.                 CAD.  Sonographer:    Jyl Heinz Referring Phys: [place]  1. Left ventricular ejection fraction, by estimation, is 60 to 65%. The left ventricle has normal function. The left ventricle has no regional wall motion abnormalities. Left ventricular diastolic parameters are consistent with Grade I diastolic dysfunction (impaired relaxation).  2. Right ventricular systolic function is normal. The right ventricular size is normal.  3. The mitral valve is normal in structure. Trivial mitral valve regurgitation. No evidence of mitral stenosis.  4. The aortic valve is normal in structure. Aortic valve regurgitation is mild to moderate. No aortic stenosis is present.  5. The inferior vena cava  is normal in size with greater than 50% respiratory variability, suggesting right atrial pressure of 3 mmHg. FINDINGS  Left Ventricle: Left ventricular ejection fraction, by estimation, is 60 to 65%. The left ventricle has normal function. The left ventricle has no regional wall motion abnormalities. The left ventricular internal cavity size was normal in size. There is  no left ventricular hypertrophy. Left ventricular diastolic parameters are consistent with Grade I diastolic dysfunction (impaired relaxation). Right Ventricle:  The right ventricular size is normal. No increase in right ventricular wall thickness. Right ventricular systolic function is normal. Left Atrium: Left atrial size was normal in size. Right Atrium: Right atrial size was normal in size. Pericardium: There is no evidence of pericardial effusion. Mitral Valve: The mitral valve is normal in structure. Trivial mitral valve regurgitation. No evidence of mitral valve stenosis. Tricuspid Valve: The tricuspid valve is normal in structure. Tricuspid valve regurgitation is not demonstrated. No evidence of tricuspid stenosis. Aortic Valve: The aortic valve is normal in structure. Aortic valve regurgitation is mild to moderate. Aortic regurgitation PHT measures 506 msec. No aortic stenosis is present. Aortic valve peak gradient measures 4.8 mmHg. Pulmonic Valve: The pulmonic valve was normal in structure. Pulmonic valve regurgitation is not visualized. No evidence of pulmonic stenosis. Aorta: The aortic root is normal in size and structure. Venous: The inferior vena cava is normal in size with greater than 50% respiratory variability, suggesting right atrial pressure of 3 mmHg. IAS/Shunts: No atrial level shunt detected by color flow Doppler.  LEFT VENTRICLE PLAX 2D LVIDd:         4.20 cm     Diastology LVIDs:         2.80 cm     LV e' medial:    5.66 cm/s LV PW:         0.90 cm     LV E/e' medial:  11.8 LV IVS:        1.00 cm     LV e' lateral:   8.27  cm/s LVOT diam:     2.00 cm     LV E/e' lateral: 8.1 LV SV:         70 LV SV Index:   37 LVOT Area:     3.14 cm  LV Volumes (MOD) LV vol d, MOD A2C: 93.6 ml LV vol d, MOD A4C: 89.0 ml LV vol s, MOD A2C: 36.2 ml LV vol s, MOD A4C: 34.3 ml LV SV MOD A2C:     57.4 ml LV SV MOD A4C:     89.0 ml LV SV MOD BP:      57.9 ml RIGHT VENTRICLE            IVC RV Basal diam:  2.80 cm    IVC diam: 1.70 cm RV Mid diam:    2.70 cm RV S prime:     7.72 cm/s TAPSE (M-mode): 1.4 cm LEFT ATRIUM             Index        RIGHT ATRIUM           Index LA diam:        3.60 cm 1.88 cm/m   RA Area:     11.90 cm LA Vol (A2C):   37.6 ml 19.60 ml/m  RA Volume:   23.80 ml  12.41 ml/m LA Vol (A4C):   49.8 ml 25.96 ml/m LA Biplane Vol: 43.2 ml 22.52 ml/m  AORTIC VALVE AV Area (Vmax): 2.63 cm AV Vmax:        109.00 cm/s AV Peak Grad:   4.8 mmHg LVOT Vmax:      91.40 cm/s LVOT Vmean:     61.600 cm/s LVOT VTI:       0.223 m AI PHT:         506 msec  AORTA Ao Root diam: 3.40 cm Ao Asc diam:  3.10 cm MITRAL VALVE  TRICUSPID VALVE MV Area (PHT): 4.01 cm    TR Peak grad:   21.9 mmHg MV Decel Time: 189 msec    TR Vmax:        234.00 cm/s MV E velocity: 66.80 cm/s MV A velocity: 71.10 cm/s  SHUNTS MV E/A ratio:  0.94        Systemic VTI:  0.22 m                            Systemic Diam: 2.00 cm Candee Furbish MD Electronically signed by Candee Furbish MD Signature Date/Time: 05/08/2021/2:25:40 PM    Final    ECHO INTRAOPERATIVE TEE  Result Date: 05/15/2021  *INTRAOPERATIVE TRANSESOPHAGEAL REPORT *  Patient Name:   Raheen Alleyne   Date of Exam: 05/15/2021 Medical Rec #:  [id number]      Height:       70.0 in Accession #:    [id number]     Weight:       167.0 lb Date of Birth:  May 09, 1945      BSA:          1.93 m Patient Age:    72 years       BP:           121/70 mmHg Patient Gender: M              HR:           58 bpm. Exam Location:  Anesthesiology Transesophogeal exam was perform intraoperatively during surgical procedure. Patient was  closely monitored under general anesthesia during the entirety of examination. Indications:     Coronary Artery Disease Sonographer:     Bernadene Person RDCS Performing Phys: [id number] [name] LIGHTFOOT Diagnosing Phys: Belinda Block MD POST-OP IMPRESSIONS _ Left Ventricle: Post Bypass: The patient came off bypass on the first attempt. Left ventricular contraction did improve with volume replacement. There did not appear to be any new findings from preop exam. The TEE that had been placed after induction uneventfully was removed at the end of the case without difficulty. The patient was later taken to the SICU in stable condition. PRE-OP FINDINGS  Left Ventricle: The left ventricle has low normal systolic function, with an ejection fraction of 50-55%. Right Ventricle: The right ventricle has normal systolic function. Left Atrium: Left atrial size was normal in size. No left atrial/left atrial appendage thrombus was detected. Mitral Valve: Mitral valve regurgitation is mild by color flow Doppler. Tricuspid Valve: The tricuspid valve was normal in structure. Aortic Valve: The aortic valve is tricuspid Aortic valve regurgitation is moderate by color flow Doppler. Pulmonic Valve: The pulmonic valve was normal in structure.   Belinda Block MD Electronically signed by Belinda Block MD Signature Date/Time: 05/15/2021/4:07:02 PM   Final    VAS US DOPPLER PRE CABG  Result Date: 05/13/2021 PREOPERATIVE VASCULAR EVALUATION Patient Name:  [facility]  Date of Exam:   05/13/2021 Medical Rec #: [id number]     Accession #:    [id number] Date of Birth: 07/02/1945     Patient Gender: M Patient Age:   18 years Exam Location:  [facility] Procedure:      VAS US DOPPLER PRE CABG Referring Phys: HARRELL LIGHTFOOT --------------------------------------------------------------------------------  Indications:      Pre-CABG. Risk Factors:     Hyperlipidemia. Comparison Study: No prior study Performing Technologist: Maudry Mayhew MHA, RVT, RDCS, RDMS  Examination Guidelines: A complete evaluation includes B-mode imaging,  spectral Doppler, color Doppler, and power Doppler as needed of all accessible portions of each vessel. Bilateral testing is considered an integral part of a complete examination. Limited examinations for reoccurring indications may be performed as noted.  Right Carotid Findings: +----------+--------+--------+--------+------------+--------+           PSV cm/sEDV cm/sStenosisDescribe    Comments +----------+--------+--------+--------+------------+--------+ CCA Prox  62      10              heterogenous         +----------+--------+--------+--------+------------+--------+ CCA Distal76      12              heterogenous         +----------+--------+--------+--------+------------+--------+ ICA Prox  70      12                                   +----------+--------+--------+--------+------------+--------+ ICA Distal82      18                                   +----------+--------+--------+--------+------------+--------+ ECA       94                                           +----------+--------+--------+--------+------------+--------+ +----------+--------+-------+----------------+------------+           PSV cm/sEDV cmsDescribe        Arm Pressure +----------+--------+-------+----------------+------------+ Subclavian115            Multiphasic, WNL             +----------+--------+-------+----------------+------------+ +---------+--------+--+--------+-+---------+ VertebralPSV cm/s41EDV cm/s9Antegrade +---------+--------+--+--------+-+---------+ Left Carotid Findings: +----------+--------+--------+--------+-------------------------+--------+           PSV cm/sEDV cm/sStenosisDescribe                 Comments +----------+--------+--------+--------+-------------------------+--------+ CCA Prox  178     21                                                 +----------+--------+--------+--------+-------------------------+--------+ CCA Distal147     21              smooth and heterogenous           +----------+--------+--------+--------+-------------------------+--------+ ICA Prox  50      9               heterogenous and calcific         +----------+--------+--------+--------+-------------------------+--------+ ICA Distal82      15                                                +----------+--------+--------+--------+-------------------------+--------+ ECA       108     12                                                +----------+--------+--------+--------+-------------------------+--------+ +----------+--------+--------+----------------+------------+  SubclavianPSV cm/sEDV cm/sDescribe        Arm Pressure +----------+--------+--------+----------------+------------+           118             Multiphasic, WNL             +----------+--------+--------+----------------+------------+ +---------+--------+--+--------+--+---------+ VertebralPSV cm/s46EDV cm/s14Antegrade +---------+--------+--+--------+--+---------+  ABI Findings: +--------+------------------+-----+---------+--------+ Right   Rt Pressure (mmHg)IndexWaveform Comment  +--------+------------------+-----+---------+--------+ Brachial120                    triphasic         +--------+------------------+-----+---------+--------+ PTA                            triphasic         +--------+------------------+-----+---------+--------+ DP                             triphasic         +--------+------------------+-----+---------+--------+ +--------+------------------+-----+---------+-------+ Left    Lt Pressure (mmHg)IndexWaveform Comment +--------+------------------+-----+---------+-------+ [id number]                    triphasic        +--------+------------------+-----+---------+-------+ PTA                            triphasic         +--------+------------------+-----+---------+-------+ DP                             triphasic        +--------+------------------+-----+---------+-------+  Right Doppler Findings: +-----------+--------+-----+---------+-----------------------------------------+ Site       PressureIndexDoppler  Comments                                  +-----------+--------+-----+---------+-----------------------------------------+ Brachial   120          triphasic                                          +-----------+--------+-----+---------+-----------------------------------------+ Radial                  triphasic                                          +-----------+--------+-----+---------+-----------------------------------------+ Ulnar                   triphasic                                          +-----------+--------+-----+---------+-----------------------------------------+ Palmar Arch                      Signal obliterates with radial                                             compression, is unaffected with ulnar  compression.                              +-----------+--------+-----+---------+-----------------------------------------+  Left Doppler Findings: +-----------+--------+-----+---------+--------------------+ Site       PressureIndexDoppler  Comments             +-----------+--------+-----+---------+--------------------+ Brachial   122          triphasic                     +-----------+--------+-----+---------+--------------------+ Radial                  triphasic                     +-----------+--------+-----+---------+--------------------+ Ulnar                   triphasic                     +-----------+--------+-----+---------+--------------------+ Palmar Arch                      Within normal limits +-----------+--------+-----+---------+--------------------+  Summary: Right  Carotid: Velocities in the right ICA are consistent with a 1-39% stenosis. Left Carotid: Velocities in the left ICA are consistent with a 1-39% stenosis. Vertebrals:  Bilateral vertebral arteries demonstrate antegrade flow. Subclavians: Normal flow hemodynamics were seen in bilateral subclavian              arteries. Right Upper Extremity: Doppler waveform obliterate with right radial compression. Doppler waveforms remain within normal limits with right ulnar compression. Left Upper Extremity: Doppler waveforms remain within normal limits with left radial compression. Doppler waveforms remain within normal limits with left ulnar compression.  Electronically signed by Servando Snare MD on 05/13/2021 at 7:53:28 PM.    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  LIMA LAD, RSVG PDA, OM1   Open greater saphenous vein harvest on the right and left     Surgeon and Role:      * Lightfoot, [name], MD - Primary    Evonnie Pat, PA-C - assisting  Discharge Exam:   General appearance: alert, cooperative, and no distress Heart: regular rate and rhythm Lungs: dim in bases Abdomen: benign Extremities: minor edema Wound: incis healing well Discharge Medications:  The patient has been discharged on:   1.Beta Blocker:  Yes [ y  ]                              No   [   ]                              If No, reason:  2.Ace Inhibitor/ARB: Yes [   ]                                     No  [ n   ]  If No, reason:can start as outpatient as BP allows  3.Statin:   Yes [ y  ]                  No  [   ]                  If No, reason:  4.Ecasa:  Yes  [  y ]                  No   [   ]                  If No, reason:  Patient had ACS upon admission:  Plavix/P2Y12 inhibitor: Yes [   ]                                      No  [   ]     Discharge Instructions      Amb Referral to Cardiac Rehabilitation   Complete by: As directed    Will send Cardiac Rehab Phase 2 referral to [facility]   Diagnosis: CABG   CABG X ___: 3   After initial evaluation and assessments completed: Virtual Based Care may be provided alone or in conjunction with Phase 2 Cardiac Rehab based on patient barriers.: Yes   Discharge patient   Complete by: As directed    Discharge disposition: 01-Home or Self Care   Discharge patient date: 05/21/2021      Allergies as of 05/21/2021       Reactions   Oxycodone    Other reaction(s): Other (See Comments) "made me feel funny"   Statins Other (See Comments)   Myalgia        Medication List     STOP taking these medications    naproxen sodium 220 MG tablet Commonly known as: ALEVE   nitroGLYCERIN 0.4 MG SL tablet Commonly known as: NITROSTAT       TAKE these medications    amiodarone 400 MG tablet Commonly known as: PACERONE Take 1 tablet (400 mg total) by mouth 2 (two) times daily. For 7 days, then 400 mg once daily   aspirin 325 MG EC tablet Take 1 tablet (325 mg total) by mouth daily. What changed:  medication strength how much to take additional instructions   Coenzyme Q10 100 MG capsule Take 100 mg by mouth daily.   ferrous [id number] C-folic acid capsule Commonly known as: TRINSICON / FOLTRIN Take 1 capsule by mouth 2 (two) times daily after a meal.   metoprolol succinate 25 MG 24 hr tablet Commonly known as: Toprol XL Take 0.5 tablets (12.5 mg total) by mouth daily.   omeprazole 20 MG capsule Commonly known as: PRILOSEC Take 20 mg by mouth daily.   ONE-DAILY MULTI-VITAMIN PO Take 1 tablet by mouth daily.   oxyCODONE 5 MG immediate release tablet Commonly known as: Oxy IR/ROXICODONE Take 1 tablet (5 mg total) by mouth every 6 (six) hours as needed for severe pain.   rosuvastatin 20 MG tablet Commonly known as: CRESTOR Take 1 tablet (20 mg total) by mouth daily.    triamcinolone cream 0.1 % Commonly known as: KENALOG Apply 1 application. topically 2 (two) times daily.         Signed:  John Giovanni, PA-C  05/21/2021, 12:17 PM

## 2021-05-17 ENCOUNTER — Telehealth: Payer: Self-pay

## 2021-05-17 ENCOUNTER — Inpatient Hospital Stay (HOSPITAL_COMMUNITY): Payer: Medicare HMO

## 2021-05-17 DIAGNOSIS — Z951 Presence of aortocoronary bypass graft: Secondary | ICD-10-CM | POA: Diagnosis not present

## 2021-05-17 LAB — CBC
HCT: 23.5 % — ABNORMAL LOW (ref 39.0–52.0)
Hemoglobin: 8.4 g/dL — ABNORMAL LOW (ref 13.0–17.0)
MCH: 31.1 pg (ref 26.0–34.0)
MCHC: 35.7 g/dL (ref 30.0–36.0)
MCV: 87 fL (ref 80.0–100.0)
Platelets: 104 10*3/uL — ABNORMAL LOW (ref 150–400)
RBC: 2.7 MIL/uL — ABNORMAL LOW (ref 4.22–5.81)
RDW: 12.2 % (ref 11.5–15.5)
WBC: 16.8 10*3/uL — ABNORMAL HIGH (ref 4.0–10.5)
nRBC: 0 % (ref 0.0–0.2)

## 2021-05-17 LAB — GLUCOSE, CAPILLARY
Glucose-Capillary: 112 mg/dL — ABNORMAL HIGH (ref 70–99)
Glucose-Capillary: 164 mg/dL — ABNORMAL HIGH (ref 70–99)
Glucose-Capillary: 106 mg/dL — ABNORMAL HIGH (ref 70–99)
Glucose-Capillary: 139 mg/dL — ABNORMAL HIGH (ref 70–99)
Glucose-Capillary: 136 mg/dL — ABNORMAL HIGH (ref 70–99)

## 2021-05-17 LAB — BASIC METABOLIC PANEL
Anion gap: 6 (ref 5–15)
BUN: 29 mg/dL — ABNORMAL HIGH (ref 8–23)
CO2: 25 mmol/L (ref 22–32)
Calcium: 8.1 mg/dL — ABNORMAL LOW (ref 8.9–10.3)
Chloride: 100 mmol/L (ref 98–111)
Creatinine, Ser: 1.06 mg/dL (ref 0.61–1.24)
GFR, Estimated: >60 mL/min (ref >60)
Glucose, Bld: 161 mg/dL — ABNORMAL HIGH (ref 70–99)
Potassium: 4.6 mmol/L (ref 3.5–5.1)
Sodium: 131 mmol/L — ABNORMAL LOW (ref 135–145)

## 2021-05-17 MED ORDER — AMIODARONE LOAD VIA INFUSION
150.0000 mg | Freq: Once | INTRAVENOUS | Status: AC
  Administered 2021-05-17: 150 mg via INTRAVENOUS

## 2021-05-17 MED ORDER — AMIODARONE HCL IN DEXTROSE 360-4.14 MG/200ML-% IV SOLN
INTRAVENOUS | Status: AC
  Filled 2021-05-17: qty 200

## 2021-05-17 MED ORDER — AMIODARONE LOAD VIA INFUSION
150.0000 mg | Freq: Once | INTRAVENOUS | Status: AC
  Administered 2021-05-17: 150 mg via INTRAVENOUS
  Filled 2021-05-17: qty 83.34

## 2021-05-17 MED ORDER — ALBUMIN HUMAN 5 % IV SOLN
12.5000 g | Freq: Once | INTRAVENOUS | Status: AC
  Administered 2021-05-17: 12.5 g via INTRAVENOUS

## 2021-05-17 MED ORDER — FE FUMARATE-B12-VIT C-FA-IFC PO CAPS
1.0000 | ORAL_CAPSULE | Freq: Two times a day (BID) | ORAL | Status: DC
  Administered 2021-05-17 – 2021-05-21 (×9): 1 via ORAL
  Filled 2021-05-17 (×9): qty 1

## 2021-05-17 MED ORDER — AMIODARONE HCL IN DEXTROSE 360-4.14 MG/200ML-% IV SOLN
30.0000 mg/h | INTRAVENOUS | Status: DC
  Administered 2021-05-17 – 2021-05-18 (×3): 30 mg/h via INTRAVENOUS
  Filled 2021-05-17 (×2): qty 200

## 2021-05-17 MED ORDER — AMIODARONE IV BOLUS ONLY 150 MG/100ML
INTRAVENOUS | Status: AC
Start: 2021-05-17 — End: 2021-05-17
  Filled 2021-05-17: qty 100

## 2021-05-17 MED ORDER — AMIODARONE HCL IN DEXTROSE 360-4.14 MG/200ML-% IV SOLN
60.0000 mg/h | INTRAVENOUS | Status: AC
  Administered 2021-05-17 (×2): 60 mg/h via INTRAVENOUS
  Filled 2021-05-17: qty 200

## 2021-05-17 MED FILL — Heparin Sodium (Porcine) Inj 1000 Unit/ML: INTRAMUSCULAR | Qty: 10 | Status: AC

## 2021-05-17 MED FILL — Sodium Chloride IV Soln 0.9%: INTRAVENOUS | Qty: 2000 | Status: AC

## 2021-05-17 MED FILL — Heparin Sodium (Porcine) Inj 1000 Unit/ML: Qty: 1000 | Status: AC

## 2021-05-17 MED FILL — Sodium Bicarbonate IV Soln 8.4%: INTRAVENOUS | Qty: 50 | Status: AC

## 2021-05-17 MED FILL — Lidocaine HCl Local Preservative Free (PF) Inj 2%: INTRAMUSCULAR | Qty: 15 | Status: AC

## 2021-05-17 MED FILL — Electrolyte-R (PH 7.4) Solution: INTRAVENOUS | Qty: 3000 | Status: AC

## 2021-05-17 MED FILL — Calcium Chloride Inj 10%: INTRAVENOUS | Qty: 10 | Status: AC

## 2021-05-17 MED FILL — Mannitol IV Soln 20%: INTRAVENOUS | Qty: 500 | Status: AC

## 2021-05-17 MED FILL — Potassium Chloride Inj 2 mEq/ML: INTRAVENOUS | Qty: 20 | Status: AC

## 2021-05-17 NOTE — Plan of Care (Signed)

## 2021-05-17 NOTE — Progress Notes (Signed)
? ?  NAME:  Derek Wood, MRN:  [id number], DOB:  Jul 16, 1945, LOS: 2 ?ADMISSION DATE:  05/15/2021, CONSULTATION DATE:  5/10 ?REFERRING MD:  Kipp Brood, CHIEF COMPLAINT:  post-op critical care   ? ?History of Present Illness:  ? ?76 year old male w/ 3V CAD and PMH as outlined below. Went to OR 5/10 for elective 4V CABG.  ?PCCM asked to assist w/ post-op management  ?Pertinent  Medical History  ?3 V CAD w/ mod AVR (with strong family dx of heart disease); baseline ECHO 60-65% no RWM abnormality; grade I diastolic dysfxn ?Baseline RBBB ?GERD ?HL ? ?Significant Hospital Events: ?Including procedures, antibiotic start and stop dates in addition to other pertinent events   ?5/10 4 V CABG  ? ?Interim History / Subjective:  ?Off pressors. AF/RVR overnight, amio bolus and infusion started. Converted back to NSR/borderline sinus tach this morning. ? ?Objective   ?Blood pressure (!) 83/54, pulse (!) 115, temperature 98.3 ?F (36.8 ?C), temperature source Oral, resp. rate 14, height '5\' 10"'$  (1.778 m), weight 75.3 kg, SpO2 100 %. ?CVP:  [0 mmHg-15 mmHg] 15 mmHg  ?   ? ?Intake/Output Summary (Last 24 hours) at 05/17/2021 0957 ?Last data filed at 05/17/2021 0800 ?Gross per 24 hour  ?Intake 1653.93 ml  ?Output 565 ml  ?Net 1088.93 ml  ? ?Filed Weights  ? 05/15/21 0656 05/16/21 0500 05/17/21 0500  ?Weight: 75.8 kg 75.3 kg 75.3 kg  ? ? ?Examination: ?General appearance: 76 y.o., male, NAD, conversant  ?Eyes: PERRL, tracking appropriately ?HENT: NCAT; MMM ?Lungs: CTAB, no crackles, no wheeze, with normal respiratory effort, sternotomy site dressing cdi ?CV: tachy now regular, no murmur  ?Abdomen: Soft, non-tender; non-distended, BS present  ?Extremities: No peripheral edema, lukewarm-cool, harvest site dressings cdi ?Neuro: Follows commands, grossly nonfocal ? ? ?Review of CXR - stable ? ?Resolved Hospital Problem list   ? ? ?Assessment & Plan:  ?Principal Problem: ?  S/P CABG x 4 ?Active Problems: ?  Mixed hyperlipidemia ?  GERD  (gastroesophageal reflux disease) ?  Coronary artery disease of native artery of native heart with stable angina pectoris ([place]) ?  History of right bundle branch block (RBBB) ?  Grade I diastolic dysfunction ?  Aortic regurgitation ?  S/P CABG x 3 ? ?S/p CABG x4 in setting of known 3 V CAD ?Some ongoing low MAPs driven by low diastolic pressures suspected due to AI ?Plan ?Wean pressors for systolic 709 per Dr. Kipp Brood ?Usual postop bundle ? ?Post-op AF/RVR ?Converted back to NSR ?Plan ?Continue amio infusion for now ? ?H/o HL ?Plan ?Crestor ? ?H/o GERD ?Plan ?PPI ? ?Will follow while in ICU ?Plan d/w RN and Dr. Kipp Brood ? ? ? ?Walker Shadow  ?[place] ? ?

## 2021-05-17 NOTE — Progress Notes (Signed)
eLink Physician-Brief Progress Note ?Patient Name: Derek Wood ?DOB: 08/31/1945 ?MRN: [id number] ? ? ?Date of Service ? 05/17/2021  ?HPI/Events of Note ? Afib rvr HR 170-180 ?Sys bp 90  ?eICU Interventions ? Amiodarone bolus and infusion  ? ? ? ?Intervention Category ?Major Interventions: Arrhythmia - evaluation and management ? ?Mauri Brooklyn, P ?05/17/2021, 5:45 AM ?

## 2021-05-17 NOTE — Progress Notes (Addendum)
2 Days Post-Op Procedure(s) (LRB): ?CORONARY ARTERY BYPASS GRAFTING (CABG) TIMES THREE   USING LEFT INTERNAL MAMMARY ARTERY AND RIGHT AND LEFT SAPHENOUS VEIN (N/A) ?TRANSESOPHAGEAL ECHOCARDIOGRAM (TEE) (N/A) ?Subjective: ?Feels fairly well overall, now in afib with RVR ? ?Objective: ?Vital signs in last 24 hours: ?Temp:  [98.2 ?F (36.8 ?C)-99.5 ?F (37.5 ?C)] 98.7 ?F (37.1 ?C) (05/12 0300) ?Pulse Rate:  [103-187] 129 (05/12 0645) ?Cardiac Rhythm: Atrial fibrillation (05/12 0540) ?Resp:  [12-29] 14 (05/12 0645) ?BP: (78-106)/(35-72) 78/49 (05/12 0645) ?SpO2:  [91 %-100 %] 98 % (05/12 0645) ?Arterial Line BP: (80-155)/(29-54) 121/50 (05/11 1700) ?Weight:  [75.3 kg] 75.3 kg (05/12 0500) ? ?Hemodynamic parameters for last 24 hours: ?CVP:  [0 mmHg-15 mmHg] 15 mmHg ? ?Intake/Output from previous day: ?05/11 0701 - 05/12 0700 ?In: 2253.8 [P.O.:200; I.V.:1577; IV Piggyback:476.8] ?Out: 625 [Urine:455; Chest Tube:170] ?Intake/Output this shift: ?No intake/output data recorded. ? ?General appearance: alert, cooperative, and no distress ?Heart: irregularly irregular rhythm and tachy ?Lungs: clear to auscultation bilaterally ?Abdomen: benign ?Extremities: minor edema ?Wound: minor drainage from leg incisions ? ?Lab Results: ?Recent Labs  ?  05/16/21 ?1628 05/17/21 ?0426  ?WBC 13.9* 16.8*  ?HGB 8.4* 8.4*  ?HCT 23.9* 23.5*  ?PLT 97* 104*  ? ?BMET:  ?Recent Labs  ?  05/16/21 ?1628 05/17/21 ?0426  ?NA 134* 131*  ?K 4.2 4.6  ?CL 105 100  ?CO2 22 25  ?GLUCOSE 158* 161*  ?BUN 23 29*  ?CREATININE 1.02 1.06  ?CALCIUM 7.8* 8.1*  ?  ?PT/INR:  ?Recent Labs  ?  05/15/21 ?1427  ?LABPROT 16.6*  ?INR 1.4*  ? ?ABG ?   ?Component Value Date/Time  ? PHART 7.361 05/15/2021 2223  ? HCO3 19.9 (L) 05/15/2021 2223  ? TCO2 21 (L) 05/15/2021 2223  ? ACIDBASEDEF 5.0 (H) 05/15/2021 2223  ? O2SAT 99 05/15/2021 2223  ? ?CBG (last 3)  ?Recent Labs  ?  05/16/21 ?2001 05/16/21 ?2324 05/17/21 ?0351  ?GLUCAP 131* 134* 112*  ? ? ?Meds ?Scheduled Meds: ?  acetaminophen  1,000 mg Oral Q6H  ? Or  ? acetaminophen (TYLENOL) oral liquid 160 mg/5 mL  1,000 mg Per Tube Q6H  ? aspirin EC  325 mg Oral Daily  ? Or  ? aspirin  324 mg Per Tube Daily  ? bisacodyl  10 mg Oral Daily  ? Or  ? bisacodyl  10 mg Rectal Daily  ? Chlorhexidine Gluconate Cloth  6 each Topical Daily  ? Chlorhexidine Gluconate Cloth  6 each Topical Daily  ? docusate sodium  200 mg Oral Daily  ? hydrocortisone cream   Topical BID  ? insulin aspart  0-24 Units Subcutaneous Q4H  ? mouth rinse  15 mL Mouth Rinse BID  ? metoprolol tartrate  12.5 mg Oral BID  ? Or  ? metoprolol tartrate  12.5 mg Per Tube BID  ? pantoprazole  40 mg Oral Daily  ? rosuvastatin  20 mg Oral Daily  ? sodium chloride flush  10-40 mL Intracatheter Q12H  ? sodium chloride flush  3 mL Intravenous Q12H  ? ?Continuous Infusions: ? sodium chloride    ? sodium chloride Stopped (05/16/21 0839)  ? sodium chloride Stopped (05/16/21 2303)  ? amiodarone 60 mg/hr (05/17/21 0700)  ? Followed by  ? amiodarone    ? amiodarone    ? amiodarone    ? lactated ringers    ? lactated ringers Stopped (05/17/21 0647)  ? norepinephrine (LEVOPHED) Adult infusion Stopped (05/16/21 1114)  ? ?PRN Meds:.sodium chloride,  dextrose, metoprolol tartrate, midazolam, morphine injection, ondansetron (ZOFRAN) IV, oxyCODONE, sodium chloride flush, sodium chloride flush, traMADol ? ?Xrays ?DG Chest Port 1 View ? ?Result Date: 05/17/2021 ?CLINICAL DATA:  Pneumothorax EXAM: PORTABLE CHEST 1 VIEW COMPARISON:  Chest x-ray dated May 16, 2021 FINDINGS: Cardiac and mediastinal contours are unchanged post median sternotomy and CABG. Unchanged position of right IJ line and bilateral chest tubes. Mild left basilar opacities, likely due to atelectasis. No large pleural effusion or of pneumothorax. IMPRESSION: 1. Stable support devices. 2. Stable left basilar atelectasis. Electronically Signed   By: Yetta Glassman M.D.   On: 05/17/2021 08:00  ? ?DG Chest Port 1 View ? ?Result Date:  05/16/2021 ?CLINICAL DATA:  Status post CABG x3 yesterday, chest pain without shortness of breath, EXAM: PORTABLE CHEST 1 VIEW COMPARISON:  Chest x-ray from yesterday FINDINGS: Again seen are sternotomy wires and mediastinal clips of CABG. There has been interval removal of the endotracheal and enteric tubes. Right transjugular catheter with its tip at the lower SVC region. Stable right-sided chest tube and mediastinal tube. Cardiomediastinal silhouette has a normal appearance. No pleural effusion or pneumothorax. Mild atelectasis at the left lung base without significant interval change. No vascular congestion. IMPRESSION: Post CABG changes. Mild atelectasis at the left lung base without significant interval change. Interval removal of the ET and enteric tubes. Other supporting tubes as above. Electronically Signed   By: Frazier Richards M.D.   On: 05/16/2021 08:53  ? ?DG Chest Port 1 View ? ?Result Date: 05/15/2021 ?CLINICAL DATA:  Status post CABG x3. EXAM: PORTABLE CHEST 1 VIEW COMPARISON:  Yesterday 05/13/2021 FINDINGS: Interval median sternotomy for CABG. Endotracheal tube terminates 6.2 cm above carina. Nasogastric tube extends beyond the inferior aspect of the film. Right internal jugular line tip at low SVC. Right chest tube and mediastinal drain versus a second right chest tube identified. Midline trachea. Borderline cardiomegaly. No pleural effusion or pneumothorax. No congestive failure. Mild bibasilar atelectasis. IMPRESSION: Appropriate position of support apparatus, as detailed above. Slightly worsened bibasilar aeration with developing left greater than right base atelectasis. Electronically Signed   By: Abigail Miyamoto M.D.   On: 05/15/2021 14:34   ? ?Assessment/Plan: ?S/P Procedure(s) (LRB): ?CORONARY ARTERY BYPASS GRAFTING (CABG) TIMES THREE   USING LEFT INTERNAL MAMMARY ARTERY AND RIGHT AND LEFT SAPHENOUS VEIN (N/A) ?TRANSESOPHAGEAL ECHOCARDIOGRAM (TEE) (N/A) ? ? ?POD#2 ?1 Tmax 99.5, s BP 78-106, no  gtts, afib- on amio gtt ?2 sats ok on RA ?3 poor UOP, normal renal fxn ?4 leukocytosis trending higher ?5 expected ABLA, H/H low but stable, add iron, close to transfusion threshold ?6 CT drainage- 170,  d/c today ?7 BS control good not a diabetic ?8 CXR mostly clear ?8 rehab and pulm hygiene ?9 keep in ICU for now ? ? ? LOS: 2 days  ? ? ?John Giovanni PA-C ?Pqger [phone] ?05/17/2021 ?  ? ?Atrial fibrillation this morning. ?Started on amiodarone drip and converted back to sinus. ?We will remove chest tubes today. ?Pulmonary hygiene ?Ambulation ?Floor tomorrow. ? ?Lajuana Matte ? ?

## 2021-05-17 NOTE — Telephone Encounter (Signed)
Attempted to call patient to schedule follow up appointment after he is discharged from the hospital. The patient's wife stated that he was still in ICU and she wanted to wait until he was discharged from the hospital to schedule the follow up appointment with Dr. Bettina Gavia.  ?

## 2021-05-17 NOTE — Progress Notes (Signed)
Patient's systolic blood pressure in 80s this AM. Received order for 1 Albumin. BP increased to mid 63W systolic with MAPs greater than 65. Also reported low urine output to TCTS PA. Bladder scan indicated approximately 150 cc's of urine. Pt reports no discomfort or urge to urinate. No new orders received.  ?

## 2021-05-17 NOTE — Progress Notes (Signed)
CT surgery PM rounds ? ?Patient back in sinus rhythm on IV amiodarone ?Blood pressure stable ?Patient walked 1 lap around hallway now resting comfortably ?Continue current care ? ?Blood pressure (!) 104/52, pulse 76, temperature 98 ?F (36.7 ?C), temperature source Oral, resp. rate 10, height '5\' 10"'$  (1.778 m), weight 75.3 kg, SpO2 94 %.  ?

## 2021-05-18 LAB — BASIC METABOLIC PANEL
Anion gap: 6 (ref 5–15)
BUN: 31 mg/dL — ABNORMAL HIGH (ref 8–23)
CO2: 28 mmol/L (ref 22–32)
Calcium: 8.2 mg/dL — ABNORMAL LOW (ref 8.9–10.3)
Chloride: 98 mmol/L (ref 98–111)
Creatinine, Ser: 1.02 mg/dL (ref 0.61–1.24)
GFR, Estimated: >60 mL/min (ref >60)
Glucose, Bld: 124 mg/dL — ABNORMAL HIGH (ref 70–99)
Potassium: 3.9 mmol/L (ref 3.5–5.1)
Sodium: 132 mmol/L — ABNORMAL LOW (ref 135–145)

## 2021-05-18 LAB — CBC
HCT: 21.2 % — ABNORMAL LOW (ref 39.0–52.0)
Hemoglobin: 7.4 g/dL — ABNORMAL LOW (ref 13.0–17.0)
MCH: 30.6 pg (ref 26.0–34.0)
MCHC: 34.9 g/dL (ref 30.0–36.0)
MCV: 87.6 fL (ref 80.0–100.0)
Platelets: 102 10*3/uL — ABNORMAL LOW (ref 150–400)
RBC: 2.42 MIL/uL — ABNORMAL LOW (ref 4.22–5.81)
RDW: 12.2 % (ref 11.5–15.5)
WBC: 11.1 10*3/uL — ABNORMAL HIGH (ref 4.0–10.5)
nRBC: 0 % (ref 0.0–0.2)

## 2021-05-18 LAB — GLUCOSE, CAPILLARY
Glucose-Capillary: 105 mg/dL — ABNORMAL HIGH (ref 70–99)
Glucose-Capillary: 125 mg/dL — ABNORMAL HIGH (ref 70–99)
Glucose-Capillary: 138 mg/dL — ABNORMAL HIGH (ref 70–99)
Glucose-Capillary: 146 mg/dL — ABNORMAL HIGH (ref 70–99)

## 2021-05-18 LAB — MAGNESIUM: Magnesium: 2.5 mg/dL — ABNORMAL HIGH (ref 1.7–2.4)

## 2021-05-18 MED ORDER — AMIODARONE HCL 200 MG PO TABS
200.0000 mg | ORAL_TABLET | Freq: Two times a day (BID) | ORAL | Status: DC
Start: 2021-05-18 — End: 2021-05-19
  Administered 2021-05-18 (×2): 200 mg via ORAL
  Filled 2021-05-18 (×3): qty 1

## 2021-05-18 MED ORDER — METOCLOPRAMIDE HCL 5 MG/ML IJ SOLN
10.0000 mg | Freq: Four times a day (QID) | INTRAMUSCULAR | Status: DC
Start: 2021-05-18 — End: 2021-05-19
  Administered 2021-05-18 – 2021-05-19 (×3): 10 mg via INTRAVENOUS
  Filled 2021-05-18 (×3): qty 2

## 2021-05-18 MED ORDER — SORBITOL 70 % SOLN
30.0000 mL | Freq: Every day | Status: AC
  Administered 2021-05-18: 30 mL via ORAL
  Filled 2021-05-18 (×3): qty 30

## 2021-05-18 MED ORDER — FUROSEMIDE 10 MG/ML IJ SOLN
20.0000 mg | Freq: Every day | INTRAMUSCULAR | Status: DC
  Administered 2021-05-19 – 2021-05-21 (×3): 20 mg via INTRAVENOUS
  Filled 2021-05-18 (×3): qty 2

## 2021-05-18 MED ORDER — FUROSEMIDE 10 MG/ML IJ SOLN
20.0000 mg | Freq: Once | INTRAMUSCULAR | Status: AC
  Administered 2021-05-18: 20 mg via INTRAVENOUS
  Filled 2021-05-18: qty 2

## 2021-05-18 MED ORDER — SIMETHICONE 80 MG PO CHEW
80.0000 mg | CHEWABLE_TABLET | Freq: Four times a day (QID) | ORAL | Status: DC
Start: 2021-05-18 — End: 2021-05-21
  Administered 2021-05-18 – 2021-05-21 (×11): 80 mg via ORAL
  Filled 2021-05-18 (×11): qty 1

## 2021-05-18 NOTE — Progress Notes (Signed)
Belenda Cruise RN aware of order to [facility].  Plan to remove after amiodarone complete. ?

## 2021-05-18 NOTE — Plan of Care (Signed)

## 2021-05-18 NOTE — Progress Notes (Signed)
CT surgery PM rounds ? ?Less nausea today ?Sinus tachycardia with stable blood pressure ?Diuresed well with IV Lasix ?Waiting for bed to transfer to stepdown ? ?Blood pressure (!) 142/57, pulse 98, temperature 98.4 ?F (36.9 ?C), temperature source Oral, resp. rate 13, height '5\' 10"'$  (1.778 m), weight 78.3 kg, SpO2 100 %.  ?

## 2021-05-18 NOTE — Progress Notes (Signed)
3 Days Post-Op Procedure(s) (LRB): ?CORONARY ARTERY BYPASS GRAFTING (CABG) TIMES THREE   USING LEFT INTERNAL MAMMARY ARTERY AND RIGHT AND LEFT SAPHENOUS VEIN (N/A) ?TRANSESOPHAGEAL ECHOCARDIOGRAM (TEE) (N/A) ?Subjective: ? ?Maintaining sinus rhythm with IV amiodarone, transition to oral dosing ?Still having episodes of nausea and burping, add short course of Reglan ?Ready to transfer to stepdown ? ?Objective: ?Vital signs in last 24 hours: ?Temp:  [97.6 ?F (36.4 ?C)-98.1 ?F (36.7 ?C)] 97.7 ?F (36.5 ?C) (05/13 0900) ?Pulse Rate:  [75-102] 75 (05/13 0930) ?Cardiac Rhythm: Normal sinus rhythm (05/13 0800) ?Resp:  [7-28] 11 (05/13 0930) ?BP: (82-119)/(40-78) 107/52 (05/13 0900) ?SpO2:  [90 %-100 %] 96 % (05/13 0930) ?Weight:  [78.3 kg] 78.3 kg (05/13 0500) ? ?Hemodynamic parameters for last 24 hours: ?  ? ?Intake/Output from previous day: ?05/12 0701 - 05/13 0700 ?In: 5277 [P.O.:720; I.V.:782.1; IV Piggyback:59.9] ?Out: 840 [Urine:800; Chest Tube:40] ?Intake/Output this shift: ?Total I/O ?In: 73.4 [I.V.:73.4] ?Out: -  ? ?  ?   Exam ? ?  General- alert and comfortable.  Sternal incision clean and dry ?   Neck- no JVD, no cervical adenopathy palpable, no carotid bruit ?  Lungs- clear without rales, wheezes ?  Cor- regular rate and rhythm, no murmur , gallop ?  Abdomen- soft, non-tender ?  Extremities - warm, non-tender, minimal edema ?  Neuro- oriented, appropriate, no focal weakness ? ? ?Lab Results: ?Recent Labs  ?  05/17/21 ?0426 05/18/21 ?0522  ?WBC 16.8* 11.1*  ?HGB 8.4* 7.4*  ?HCT 23.5* 21.2*  ?PLT 104* 102*  ? ?BMET:  ?Recent Labs  ?  05/17/21 ?0426 05/18/21 ?0522  ?NA 131* 132*  ?K 4.6 3.9  ?CL 100 98  ?CO2 25 28  ?GLUCOSE 161* 124*  ?BUN 29* 31*  ?CREATININE 1.06 1.02  ?CALCIUM 8.1* 8.2*  ?  ?PT/INR:  ?Recent Labs  ?  05/15/21 ?1427  ?LABPROT 16.6*  ?INR 1.4*  ? ?ABG ?   ?Component Value Date/Time  ? PHART 7.361 05/15/2021 2223  ? HCO3 19.9 (L) 05/15/2021 2223  ? TCO2 21 (L) 05/15/2021 2223  ? ACIDBASEDEF 5.0 (H)  05/15/2021 2223  ? O2SAT 99 05/15/2021 2223  ? ?CBG (last 3)  ?Recent Labs  ?  05/18/21 ?0006 05/18/21 ?0400 05/18/21 ?0840  ?GLUCAP 125* 105* 146*  ? ? ?Assessment/Plan: ?S/P Procedure(s) (LRB): ?CORONARY ARTERY BYPASS GRAFTING (CABG) TIMES THREE   USING LEFT INTERNAL MAMMARY ARTERY AND RIGHT AND LEFT SAPHENOUS VEIN (N/A) ?TRANSESOPHAGEAL ECHOCARDIOGRAM (TEE) (N/A) ?Mobilize ?Diuresis ?Plan for transfer to step-down: see transfer orders ?Transition to oral amiodarone, follow nausea side effect ? ? LOS: 3 days  ? ? ?Derek Wood ?05/18/2021 ? ? ?

## 2021-05-19 ENCOUNTER — Inpatient Hospital Stay (HOSPITAL_COMMUNITY): Payer: Medicare HMO

## 2021-05-19 LAB — BASIC METABOLIC PANEL
Anion gap: 7 (ref 5–15)
BUN: 27 mg/dL — ABNORMAL HIGH (ref 8–23)
CO2: 29 mmol/L (ref 22–32)
Calcium: 8.6 mg/dL — ABNORMAL LOW (ref 8.9–10.3)
Chloride: 99 mmol/L (ref 98–111)
Creatinine, Ser: 0.94 mg/dL (ref 0.61–1.24)
GFR, Estimated: >60 mL/min (ref >60)
Glucose, Bld: 148 mg/dL — ABNORMAL HIGH (ref 70–99)
Potassium: 4.1 mmol/L (ref 3.5–5.1)
Sodium: 135 mmol/L (ref 135–145)

## 2021-05-19 LAB — CBC
HCT: 22.2 % — ABNORMAL LOW (ref 39.0–52.0)
Hemoglobin: 7.9 g/dL — ABNORMAL LOW (ref 13.0–17.0)
MCH: 30.6 pg (ref 26.0–34.0)
MCHC: 35.6 g/dL (ref 30.0–36.0)
MCV: 86 fL (ref 80.0–100.0)
Platelets: 130 10*3/uL — ABNORMAL LOW (ref 150–400)
RBC: 2.58 MIL/uL — ABNORMAL LOW (ref 4.22–5.81)
RDW: 12.2 % (ref 11.5–15.5)
WBC: 11.1 10*3/uL — ABNORMAL HIGH (ref 4.0–10.5)
nRBC: 0.2 % (ref 0.0–0.2)

## 2021-05-19 MED ORDER — AMIODARONE HCL 200 MG PO TABS
400.0000 mg | ORAL_TABLET | Freq: Two times a day (BID) | ORAL | Status: DC
  Administered 2021-05-19 – 2021-05-21 (×5): 400 mg via ORAL
  Filled 2021-05-19 (×5): qty 2

## 2021-05-19 MED ORDER — ENOXAPARIN SODIUM 40 MG/0.4ML IJ SOSY
40.0000 mg | PREFILLED_SYRINGE | INTRAMUSCULAR | Status: DC
  Administered 2021-05-19: 40 mg via SUBCUTANEOUS
  Filled 2021-05-19: qty 0.4

## 2021-05-19 NOTE — Progress Notes (Signed)
4 Days Post-Op Procedure(s) (LRB): ?CORONARY ARTERY BYPASS GRAFTING (CABG) TIMES THREE   USING LEFT INTERNAL MAMMARY ARTERY AND RIGHT AND LEFT SAPHENOUS VEIN (N/A) ?TRANSESOPHAGEAL ECHOCARDIOGRAM (TEE) (N/A) ?Subjective: ?Maintaining sinus rhythm on oral amiodarone ?Problems with nausea have resolved, patient had bowel movement ?Blood pressure stable, weight still up 3.5 kg, continue daily Lasix dose ?Chest x-ray today is clear ?Overall strength is improved and patient is waiting bed on stepdown ? ?Objective: ?Vital signs in last 24 hours: ?Temp:  [98.2 ?F (36.8 ?C)-98.4 ?F (36.9 ?C)] 98.4 ?F (36.9 ?C) (05/14 0900) ?Pulse Rate:  [74-108] 108 (05/14 0003) ?Cardiac Rhythm: Sinus tachycardia (05/14 0003) ?Resp:  [7-28] 18 (05/14 0003) ?BP: (108-148)/(50-63) 127/63 (05/14 0003) ?SpO2:  [95 %-100 %] 96 % (05/14 0003) ?Weight:  [78.4 kg] 78.4 kg (05/14 0500) ? ?Hemodynamic parameters for last 24 hours: ?  ? ?Intake/Output from previous day: ?05/13 0701 - 05/14 0700 ?In: 1144.6 [P.O.:960; I.V.:184.6] ?Out: 1550 [AJOIN:8676] ?Intake/Output this shift: ?Total I/O ?In: -  ?Out: 250 [Urine:250] ? ?  ?   Exam ? ?  General- alert and comfortable ?   Neck- no JVD, no cervical adenopathy palpable, no carotid bruit ?  Lungs- clear without rales, wheezes ?  Cor- regular rate and rhythm, no murmur , gallop ?  Abdomen- soft, non-tender ?  Extremities - warm, non-tender, minimal edema ?  Neuro- oriented, appropriate, no focal weakness ? ? ?Lab Results: ?Recent Labs  ?  05/18/21 ?0522 05/19/21 ?7209  ?WBC 11.1* 11.1*  ?HGB 7.4* 7.9*  ?HCT 21.2* 22.2*  ?PLT 102* 130*  ? ?BMET:  ?Recent Labs  ?  05/18/21 ?0522 05/19/21 ?4709  ?NA 132* 135  ?K 3.9 4.1  ?CL 98 99  ?CO2 28 29  ?GLUCOSE 124* 148*  ?BUN 31* 27*  ?CREATININE 1.02 0.94  ?CALCIUM 8.2* 8.6*  ?  ?PT/INR: No results for input(s): LABPROT, INR in the last 72 hours. ?ABG ?   ?Component Value Date/Time  ? PHART 7.361 05/15/2021 2223  ? HCO3 19.9 (L) 05/15/2021 2223  ? TCO2 21 (L)  05/15/2021 2223  ? ACIDBASEDEF 5.0 (H) 05/15/2021 2223  ? O2SAT 99 05/15/2021 2223  ? ?CBG (last 3)  ?Recent Labs  ?  05/18/21 ?0400 05/18/21 ?0840 05/18/21 ?1114  ?GLUCAP 105* 146* 138*  ? ? ?Assessment/Plan: ?S/P Procedure(s) (LRB): ?CORONARY ARTERY BYPASS GRAFTING (CABG) TIMES THREE   USING LEFT INTERNAL MAMMARY ARTERY AND RIGHT AND LEFT SAPHENOUS VEIN (N/A) ?TRANSESOPHAGEAL ECHOCARDIOGRAM (TEE) (N/A) ?Mobilize ?Diuresis ?Plan for transfer to step-down: see transfer orders ?Patient should not need oral anticoagulation for transient postop A-fib ? ? LOS: 4 days  ? ? ?Derek Wood ?05/19/2021 ?  ?

## 2021-05-20 LAB — CBC
HCT: 21.4 % — ABNORMAL LOW (ref 39.0–52.0)
Hemoglobin: 7.1 g/dL — ABNORMAL LOW (ref 13.0–17.0)
MCH: 30 pg (ref 26.0–34.0)
MCHC: 33.2 g/dL (ref 30.0–36.0)
MCV: 90.3 fL (ref 80.0–100.0)
Platelets: 145 10*3/uL — ABNORMAL LOW (ref 150–400)
RBC: 2.37 MIL/uL — ABNORMAL LOW (ref 4.22–5.81)
RDW: 12.4 % (ref 11.5–15.5)
WBC: 8.1 10*3/uL (ref 4.0–10.5)
nRBC: 0.2 % (ref 0.0–0.2)

## 2021-05-20 LAB — BASIC METABOLIC PANEL
Anion gap: 8 (ref 5–15)
BUN: 20 mg/dL (ref 8–23)
CO2: 27 mmol/L (ref 22–32)
Calcium: 8.3 mg/dL — ABNORMAL LOW (ref 8.9–10.3)
Chloride: 104 mmol/L (ref 98–111)
Creatinine, Ser: 0.97 mg/dL (ref 0.61–1.24)
GFR, Estimated: >60 mL/min (ref >60)
Glucose, Bld: 108 mg/dL — ABNORMAL HIGH (ref 70–99)
Potassium: 4.1 mmol/L (ref 3.5–5.1)
Sodium: 139 mmol/L (ref 135–145)

## 2021-05-20 MED ORDER — ENOXAPARIN SODIUM 40 MG/0.4ML IJ SOSY
40.0000 mg | PREFILLED_SYRINGE | INTRAMUSCULAR | Status: DC
  Administered 2021-05-20: 40 mg via SUBCUTANEOUS
  Filled 2021-05-20: qty 0.4

## 2021-05-20 NOTE — Progress Notes (Addendum)
? ?   ?[address] ?      [place] [address] ?            [phone]   ? ?  5 Days Post-Op Procedure(s) (LRB): ?CORONARY ARTERY BYPASS GRAFTING (CABG) TIMES THREE   USING LEFT INTERNAL MAMMARY ARTERY AND RIGHT AND LEFT SAPHENOUS VEIN (N/A) ?TRANSESOPHAGEAL ECHOCARDIOGRAM (TEE) (N/A) ?Subjective: ?Feels pretty well, ambulation improving, doesn't feel too weak ? ?Objective: ?Vital signs in last 24 hours: ?Temp:  [98 ?F (36.7 ?C)-98.5 ?F (36.9 ?C)] 98.1 ?F (36.7 ?C) (05/15 0320) ?Pulse Rate:  [87-103] 87 (05/15 0320) ?Cardiac Rhythm: Sinus tachycardia (05/14 1900) ?Resp:  [13-38] 16 (05/15 0320) ?BP: (106-125)/(54-61) 121/56 (05/15 0320) ?SpO2:  [97 %-99 %] 97 % (05/15 0320) ?Weight:  [76.6 kg] 76.6 kg (05/15 0517) ? ?Hemodynamic parameters for last 24 hours: ?  ? ?Intake/Output from previous day: ?05/14 0701 - 05/15 0700 ?In: 120 [P.O.:120] ?Out: 850 [Urine:850] ?Intake/Output this shift: ?No intake/output data recorded. ? ?General appearance: alert, cooperative, and no distress ?Heart: regular rate and rhythm ?Lungs: min dim in bases ?Abdomen: benign ?Extremities: minor edema ?Wound: incis healing well, serous drainage from leg incisions ? ?Lab Results: ?Recent Labs  ?  05/19/21 ?0227 05/20/21 ?0331  ?WBC 11.1* 8.1  ?HGB 7.9* 7.1*  ?HCT 22.2* 21.4*  ?PLT 130* 145*  ? ?BMET:  ?Recent Labs  ?  05/19/21 ?0227 05/20/21 ?0331  ?NA 135 139  ?K 4.1 4.1  ?CL 99 104  ?CO2 29 27  ?GLUCOSE 148* 108*  ?BUN 27* 20  ?CREATININE 0.94 0.97  ?CALCIUM 8.6* 8.3*  ?  ?PT/INR: No results for input(s): LABPROT, INR in the last 72 hours. ?ABG ?   ?Component Value Date/Time  ? PHART 7.361 05/15/2021 2223  ? HCO3 19.9 (L) 05/15/2021 2223  ? TCO2 21 (L) 05/15/2021 2223  ? ACIDBASEDEF 5.0 (H) 05/15/2021 2223  ? O2SAT 99 05/15/2021 2223  ? ?CBG (last 3)  ?Recent Labs  ?  05/18/21 ?0400 05/18/21 ?0840 05/18/21 ?1114  ?GLUCAP 105* 146* 138*  ? ? ?Meds ?Scheduled Meds: ? acetaminophen  1,000 mg Oral Q6H  ? Or  ? acetaminophen  (TYLENOL) oral liquid 160 mg/5 mL  1,000 mg Per Tube Q6H  ? amiodarone  400 mg Oral BID  ? aspirin EC  325 mg Oral Daily  ? Or  ? aspirin  324 mg Per Tube Daily  ? bisacodyl  10 mg Oral Daily  ? Or  ? bisacodyl  10 mg Rectal Daily  ? docusate sodium  200 mg Oral Daily  ? enoxaparin (LOVENOX) injection  40 mg Subcutaneous Q24H  ? ferrous [id number] C-folic acid  1 capsule Oral BID PC  ? furosemide  20 mg Intravenous Daily  ? hydrocortisone cream   Topical BID  ? mouth rinse  15 mL Mouth Rinse BID  ? metoprolol tartrate  12.5 mg Oral BID  ? Or  ? metoprolol tartrate  12.5 mg Per Tube BID  ? pantoprazole  40 mg Oral Daily  ? rosuvastatin  20 mg Oral Daily  ? simethicone  80 mg Oral QID  ? sodium chloride flush  10-40 mL Intracatheter Q12H  ? sodium chloride flush  3 mL Intravenous Q12H  ? sorbitol  30 mL Oral Daily  ? ?Continuous Infusions: ? sodium chloride    ? sodium chloride 10 mL/hr at 05/17/21 1729  ? sodium chloride Stopped (05/16/21 2303)  ? ?PRN Meds:.sodium chloride, metoprolol tartrate, ondansetron (ZOFRAN) IV, oxyCODONE,  sodium chloride flush, sodium chloride flush, traMADol ? ?Xrays ?DG Chest Port 1 View ? ?Result Date: 05/19/2021 ?CLINICAL DATA:  CABG. EXAM: PORTABLE CHEST 1 VIEW COMPARISON:  05/17/2021 FINDINGS: 0536 hours. Interval removal right IJ central line. Possible drain over the medial right hemithorax on the prior study is no longer evident. Cardiopericardial silhouette is at upper limits of normal for size. There is some minimal atelectasis at the left base, decreased in the interval. Right lung clear. No substantial pleural effusion. Telemetry leads overlie the chest. IMPRESSION: Interval decrease in left base atelectasis. No pleural effusion or pneumothorax. Electronically Signed   By: Misty Stanley M.D.   On: 05/19/2021 08:55   ? ?Assessment/Plan: ?S/P Procedure(s) (LRB): ?CORONARY ARTERY BYPASS GRAFTING (CABG) TIMES THREE   USING LEFT INTERNAL MAMMARY ARTERY AND RIGHT AND LEFT  SAPHENOUS VEIN (N/A) ?TRANSESOPHAGEAL ECHOCARDIOGRAM (TEE) (N/A) ? ?POD#6 ?1 afeb, VSS , sinus tachy/S rhythm- no further afib ?2 sats good on RA ?3 voiding well ?4 normal renal fxn ?5 h/h now 7.1/21.4- may need to consider transfusion ?6 leukocytosis resolved ?7 thrombocytopenia trend improved ?8 cont pulm rx/rehab ?9 poss home 1-2 days ? LOS: 5 days  ? ? ?Derek Giovanni PA-C ?Pager [phone] ?05/20/2021 ?  ? ?Agree with above ?Will hold on transfusion ?Back in sinus ?If stable, home tomorrow ? ?Lajuana Matte ? ?

## 2021-05-20 NOTE — Progress Notes (Signed)
Removed epicardial wires per order. 1intact.  Pt tolerated procedure well.  Pt instructed to remain on bedrest for one hour.  Frequent vitals will be taken and documented. Pt resting with call bell within reach. ? ?

## 2021-05-20 NOTE — Care Management Important Message (Signed)
Important Message ? ?Patient Details  ?Name: Derek Wood ?MRN: [id number] ?Date of Birth: 08-30-1945 ? ? ?Medicare Important Message Given:  Yes ? ? ? ? ?[name] ?05/20/2021, 10:13 AM ?

## 2021-05-20 NOTE — Progress Notes (Signed)
CARDIAC REHAB PHASE I  ? ?PRE:  Rate/Rhythm: 100 ST ? ?BP:  Sitting: 152/83     ? ?SaO2: 100 RA ? ?MODE:  Ambulation: 370 ft  ? ?POST:  Rate/Rhythm: 132 ST ? ?BP:  Sitting: 140/54 ? ?  SaO2: 100 RA ? ? ?Pt ambulate 358f in hallway assist of one with occasional reaches for side rail. Pt returned to recliner. Encouraged continued ambulation with walker. Demonstrating IS ~2000. Pt and spouse deny DME needs as they have a walker and shower chair at home. Questions and concerns addressed. Will continue to follow. ? ?[phone] ?TRufina Falco RN BSN ?05/20/2021 ?8:53 AM ? ?

## 2021-05-21 MED ORDER — AMIODARONE HCL 400 MG PO TABS: 400.0000 mg | ORAL_TABLET | Freq: Two times a day (BID) | ORAL | 1 refills | Status: DC

## 2021-05-21 MED ORDER — ASPIRIN 325 MG PO TBEC: 325.0000 mg | DELAYED_RELEASE_TABLET | Freq: Every day | ORAL | 0 refills | Status: DC

## 2021-05-21 MED ORDER — OXYCODONE HCL 5 MG PO TABS: 5.0000 mg | ORAL_TABLET | Freq: Four times a day (QID) | ORAL | 0 refills | Status: DC | PRN

## 2021-05-21 MED ORDER — FE FUMARATE-B12-VIT C-FA-IFC PO CAPS: 1.0000 | ORAL_CAPSULE | Freq: Two times a day (BID) | ORAL | 1 refills | Status: DC

## 2021-05-21 NOTE — TOC Transition Note (Signed)
Transition of Care (TOC) - CM/SW Discharge Note ?Marvetta Gibbons Therapist, sports, BSN ?Transitions of Care ?Unit 4E- RN Case Manager ?See Treatment Team for direct phone #  ? ? ?Patient Details  ?Name: Derek Wood ?MRN: [id number] ?Date of Birth: 1945-04-26 ? ?Transition of Care (TOC) CM/SW Contact:  ?Dahlia Client, Romeo Rabon, RN ?Phone Number: ?05/21/2021, 10:30 AM ? ? ?Clinical Narrative:    ?Pt s/p CABG, stable for transition home today. Transition of Care Department [facility]) has reviewed patient and no TOC needs have been identified at this time. Family to transport home.  ? ?Final next level of care: Home/Self Care ?Barriers to Discharge: No Barriers Identified ? ? ?Patient Goals and CMS Choice ?  ?  ?Choice offered to / list presented to : NA ? ?Discharge Placement ?  ?           ? Home ?  ?  ?  ? ?Discharge Plan and Services ?In-house Referral: NA ?Discharge Planning Services: NA ?Post Acute Care Choice: NA          ?DME Arranged: N/A ?DME Agency: NA ?  ?  ?  ?HH Arranged: NA ?[place] Agency: NA ?  ?  ?  ? ?Social Determinants of Health (SDOH) Interventions ?  ? ? ?Readmission Risk Interventions ? ?  05/21/2021  ? 10:30 AM  ?Readmission Risk Prevention Plan  ?Post Dischage Appt Complete  ?Medication Screening Complete  ?Transportation Screening Complete  ? ? ? ? ? ?

## 2021-05-21 NOTE — Progress Notes (Signed)
Patient given discharge instructions. Wife present. PIV removed. Telemetry box removed, CCMD notified. Sutures removed. Patient tolerated well. Patient taken to vehicle by wheelchair by staff. ? ?Daymon Larsen, RN  ?

## 2021-05-21 NOTE — Progress Notes (Signed)
CARDIAC REHAB PHASE I  ? ?D/c education completed with pt and wife. Pt educated on importance of site care and monitoring incisions daily. Encouraged continued  IS use walks, and sternal precautions. Pt given in-the-tube sheet along with heart healthy diet. Reviewed restrictions and exercise guidelines. Will refer to CRP II [place]. ? ?[phone] ?Rufina Falco, RN BSN ?05/21/2021 ?8:36 AM ? ?

## 2021-05-21 NOTE — Progress Notes (Signed)
? ?   ?[address] ?      [place] [address] ?            [phone]   ? ?  6 Days Post-Op Procedure(s) (LRB): ?CORONARY ARTERY BYPASS GRAFTING (CABG) TIMES THREE   USING LEFT INTERNAL MAMMARY ARTERY AND RIGHT AND LEFT SAPHENOUS VEIN (N/A) ?TRANSESOPHAGEAL ECHOCARDIOGRAM (TEE) (N/A) ?Subjective: ?Feels pretty well, no specific C/O ? ?Objective: ?Vital signs in last 24 hours: ?Temp:  [98 ?F (36.7 ?C)-98.5 ?F (36.9 ?C)] 98.2 ?F (36.8 ?C) (05/16 0331) ?Pulse Rate:  [81-100] 95 (05/16 0331) ?Cardiac Rhythm: Normal sinus rhythm (05/15 2015) ?Resp:  [16-23] 16 (05/16 0551) ?BP: (113-141)/(54-76) 127/65 (05/16 0331) ?SpO2:  [94 %-100 %] 95 % (05/16 0331) ?Weight:  [74.8 kg] 74.8 kg (05/16 0527) ? ?Hemodynamic parameters for last 24 hours: ?  ? ?Intake/Output from previous day: ?No intake/output data recorded. ?Intake/Output this shift: ?No intake/output data recorded. ? ?General appearance: alert, cooperative, and no distress ?Heart: regular rate and rhythm ?Lungs: dim in bases ?Abdomen: benign ?Extremities: minor edema ?Wound: incis healing well ? ?Lab Results: ?Recent Labs  ?  05/19/21 ?0227 05/20/21 ?0331  ?WBC 11.1* 8.1  ?HGB 7.9* 7.1*  ?HCT 22.2* 21.4*  ?PLT 130* 145*  ? ?BMET:  ?Recent Labs  ?  05/19/21 ?0227 05/20/21 ?0331  ?NA 135 139  ?K 4.1 4.1  ?CL 99 104  ?CO2 29 27  ?GLUCOSE 148* 108*  ?BUN 27* 20  ?CREATININE 0.94 0.97  ?CALCIUM 8.6* 8.3*  ?  ?PT/INR: No results for input(s): LABPROT, INR in the last 72 hours. ?ABG ?   ?Component Value Date/Time  ? PHART 7.361 05/15/2021 2223  ? HCO3 19.9 (L) 05/15/2021 2223  ? TCO2 21 (L) 05/15/2021 2223  ? ACIDBASEDEF 5.0 (H) 05/15/2021 2223  ? O2SAT 99 05/15/2021 2223  ? ?CBG (last 3)  ?Recent Labs  ?  05/18/21 ?0840 05/18/21 ?1114  ?GLUCAP 146* 138*  ? ? ?Meds ?Scheduled Meds: ? amiodarone  400 mg Oral BID  ? aspirin EC  325 mg Oral Daily  ? Or  ? aspirin  324 mg Per Tube Daily  ? bisacodyl  10 mg Oral Daily  ? Or  ? bisacodyl  10 mg Rectal Daily  ?  docusate sodium  200 mg Oral Daily  ? enoxaparin (LOVENOX) injection  40 mg Subcutaneous Q24H  ? ferrous [id number] C-folic acid  1 capsule Oral BID PC  ? furosemide  20 mg Intravenous Daily  ? hydrocortisone cream   Topical BID  ? mouth rinse  15 mL Mouth Rinse BID  ? metoprolol tartrate  12.5 mg Oral BID  ? Or  ? metoprolol tartrate  12.5 mg Per Tube BID  ? pantoprazole  40 mg Oral Daily  ? rosuvastatin  20 mg Oral Daily  ? simethicone  80 mg Oral QID  ? sodium chloride flush  10-40 mL Intracatheter Q12H  ? sodium chloride flush  3 mL Intravenous Q12H  ? sorbitol  30 mL Oral Daily  ? ?Continuous Infusions: ? sodium chloride    ? sodium chloride 10 mL/hr at 05/17/21 1729  ? sodium chloride Stopped (05/16/21 2303)  ? ?PRN Meds:.sodium chloride, metoprolol tartrate, ondansetron (ZOFRAN) IV, oxyCODONE, sodium chloride flush, sodium chloride flush, traMADol ? ?Xrays ?No results found. ? ?Assessment/Plan: ?S/P Procedure(s) (LRB): ?CORONARY ARTERY BYPASS GRAFTING (CABG) TIMES THREE   USING LEFT INTERNAL MAMMARY ARTERY AND RIGHT AND LEFT SAPHENOUS VEIN (N/A) ?TRANSESOPHAGEAL ECHOCARDIOGRAM (TEE) (N/A) ?POD#7 ? ?  1 afeb, VSS, sinus rhythm/some sinus tachy ?2 sats good on RA ?3 no new labs ?4 voiding well, weight below preop ?5 stable for d/c ? LOS: 6 days  ? ? ?John Giovanni ?05/21/2021 ?  ?

## 2021-05-24 ENCOUNTER — Ambulatory Visit (INDEPENDENT_AMBULATORY_CARE_PROVIDER_SITE_OTHER): Payer: Self-pay | Admitting: Thoracic Surgery (Cardiothoracic Vascular Surgery)

## 2021-05-24 DIAGNOSIS — I2510 Atherosclerotic heart disease of native coronary artery without angina pectoris: Secondary | ICD-10-CM

## 2021-05-24 NOTE — Progress Notes (Signed)
     [address]       [place] [address]             [phone]       Patient: Home Provider: Office Consent for Telemedicine visit obtained.  Today's visit was completed via a real-time telehealth (see specific modality noted below). The patient/authorized person provided oral consent at the time of the visit to engage in a telemedicine encounter with the present provider at [facility]. The patient/authorized person was informed of the potential benefits, limitations, and risks of telemedicine. The patient/authorized person expressed understanding that the laws that protect confidentiality also apply to telemedicine. The patient/authorized person acknowledged understanding that telemedicine does not provide emergency services and that he or she would need to call 911 or proceed to the nearest hospital for help if such a need arose.   Total time spent in the clinical discussion 10 minutes.  Telehealth Modality: Phone visit (audio only)  I had a telephone visit with  Derek Wood who is s/p CABG.  Overall doing well.   Pain is minimal.  Ambulating well. Vitals have been stable.  [name] will see [place] back in 1 month with a chest x-ray for cardiac rehab clearance.  [name]

## 2021-05-24 NOTE — Telephone Encounter (Signed)
  Wife of the patient was returning call to [place]. The patient is home from the hospital now

## 2021-05-24 NOTE — Telephone Encounter (Signed)
Patient follow up appointment already scheduled

## 2021-05-27 ENCOUNTER — Telehealth (HOSPITAL_COMMUNITY): Payer: Self-pay

## 2021-05-27 NOTE — Telephone Encounter (Signed)
Per phase I cardiac rehab, fax cardiac rehab referral to [facility]. °

## 2021-05-30 ENCOUNTER — Ambulatory Visit (INDEPENDENT_AMBULATORY_CARE_PROVIDER_SITE_OTHER): Payer: Self-pay

## 2021-05-30 DIAGNOSIS — Z4802 Encounter for removal of sutures: Secondary | ICD-10-CM

## 2021-05-30 NOTE — Progress Notes (Addendum)
Patient arrived for nurse visit to remove staples post- procedure CABG x3 05/15/21 with Dr. Kipp Brood.  Approx. 50 staples removed from right/left groin, inner thigh, and lower leg with no signs/ symptoms of infection noted.  Patient tolerated procedure well.  Patient/ family instructed to keep the incision sites clean and dry.  Patient/ family acknowledged instructions given.  Only half staples removed as incision not well approximated and may dehisce if all removed. Advised patient come back in one week to have remaining staples removed. Patient acknowledged receipt. Patient did have hard knot on interior left thigh at staple site, not red, some warmth to touch. Advised to keep watch on this area of concern and if anything changes or site shows signs of infection, to contact the office for follow-up, he acknowledged receipt.

## 2021-06-06 ENCOUNTER — Other Ambulatory Visit: Payer: Self-pay

## 2021-06-06 ENCOUNTER — Ambulatory Visit (INDEPENDENT_AMBULATORY_CARE_PROVIDER_SITE_OTHER): Payer: Self-pay

## 2021-06-06 DIAGNOSIS — Z4802 Encounter for removal of sutures: Secondary | ICD-10-CM

## 2021-06-06 DIAGNOSIS — R519 Headache, unspecified: Secondary | ICD-10-CM | POA: Insufficient documentation

## 2021-06-06 NOTE — Progress Notes (Unsigned)
Cardiology Office Note:    Date:  06/07/2021   ID:  Derek Wood, DOB 02/07/1945, MRN [id number]  PCP:  Street, [place], MD  Cardiologist:  Shirlee More, MD    Referring MD: [address] [place], *    ASSESSMENT:    1. Paroxysmal atrial fibrillation (HCC)   2. On amiodarone therapy   3. Coronary artery disease of native artery of native heart with stable angina pectoris ([place])   4. Right bundle branch block   5. Mixed hyperlipidemia    PLAN:    In order of problems listed above:  Stable maintaining sinus rhythm reduce amiodarone to 100 mg/day when seen next discontinue if he maintains sinus rhythm generally not indication for long-term anticoagulation or antiarrhythmic drug therapy He is doing very well for 3 weeks remote from bypass surgery appointment to see CT surgery 06/25/2021 and then enrolling in cardiac rehabilitation he will continue high-dose aspirin for the first 90 days minimum dose of beta-blocker and his high intensity statin.  Next visit I will check a lipid profile his wife tells me he has upcoming labs and chest x-ray and CT surgery   Next appointment: 1 month   Medication Adjustments/Labs and Tests Ordered: Current medicines are reviewed at length with the patient today.  Concerns regarding medicines are outlined above.  No orders of the defined types were placed in this encounter.  No orders of the defined types were placed in this encounter.   Chief Complaint  Patient presents with   Follow-up    History of Present Illness:    Derek Wood is a 76 y.o. male with a hx of coronary artery disease cardiac CTA showing three-vessel severe coronary artery disease last seen 05/03/2021 and referred to coronary angiography.  Left heart cath confirmed multivessel CAD and 70% left main coronary artery stenosis he was admitted to the hospital underwent inpatient CABG 05/15/2021.  Postoperatively he required brief inotropic support with Levophed postoperative  day 2 developed atrial fibrillation with rapid rate treated with IV amiodarone and converted to sinus rhythm.  05/15/2021 Patient:  Derek Wood Pre-Op Dx: 3 V CAD Stable angina HTN HLP   Post-op Dx:  same Procedure: CABG X 3.  LIMA LAD, RSVG PDA, OM1   Open greater saphenous vein harvest on the right and left  0 05/08/2021: Procedures  LEFT HEART CATH AND CORONARY ANGIOGRAPHY   Conclusion      Prox RCA lesion is 80% stenosed.   Prox RCA to Mid RCA lesion is 50% stenosed.   Dist RCA lesion is 70% stenosed.   2nd Mrg lesion is 70% stenosed.   Ost LM to Mid LM lesion is 70% stenosed.   Ost LAD to Prox LAD lesion is 80% stenosed.   Mid LAD-2 lesion is 90% stenosed.   Mid LAD-1 lesion is 90% stenosed.   2nd Diag lesion is 70% stenosed.   The left ventricular systolic function is normal.   LV end diastolic pressure is normal.   The left ventricular ejection fraction is greater than 65% by visual estimate.   Severe left main and triple vessel CAD 70% diffuse left main stenosis Severe, calcific disease in the proximal and mid LAD The Circumflex gives off a large obtuse marginal branch. The obtuse marginal branch has a severe stenosis.  The RCA is a dominant vessel with severe proximal and severe distal stenosis.  Normal LV systolic function Normal LVEDP  Echocardiogram 05/08/2021:  1. Left ventricular ejection fraction, by estimation, is 60 to  65%. The  left ventricle has normal function. The left ventricle has no regional  wall motion abnormalities. Left ventricular diastolic parameters are  consistent with Grade I diastolic  dysfunction (impaired relaxation).   2. Right ventricular systolic function is normal. The right ventricular  size is normal.   3. The mitral valve is normal in structure. Trivial mitral valve  regurgitation. No evidence of mitral stenosis.   4. The aortic valve is normal in structure. Aortic valve regurgitation is  mild to moderate. No aortic  stenosis is present.   5. The inferior vena cava is normal in size with greater than 50%  respiratory variability, suggesting right atrial pressure of 3 mmHg.   Compliance with diet, lifestyle and medications: Yes  Wife is present participates in evaluation decision making He was seen at CT surgery this week for suture removal lower extremities from vein graft stripping He is feeling better every day but still has diffuse soreness. 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    Past Surgical History:  Procedure Laterality Date   COLONOSCOPY  08/2016   CORONARY ARTERY BYPASS GRAFT N/A 05/15/2021   Procedure: CORONARY ARTERY BYPASS GRAFTING (CABG) TIMES THREE   USING LEFT INTERNAL MAMMARY ARTERY AND RIGHT AND LEFT SAPHENOUS VEIN;  Surgeon: Lajuana Matte, MD;  Location: [facility];  Service: Open Heart Surgery;  Laterality: N/A;   CYSTECTOMY  04/2021   Upper right quadrant of chest   LEFT HEART CATH AND CORONARY ANGIOGRAPHY N/A 05/08/2021   Procedure: LEFT HEART CATH AND CORONARY ANGIOGRAPHY;  Surgeon: Burnell Blanks, MD;  Location: [facility];  Service: Cardiovascular;  Laterality: N/A;   ROTATOR CUFF REPAIR Right 02/2015   TEE WITHOUT CARDIOVERSION N/A 05/15/2021   Procedure: TRANSESOPHAGEAL ECHOCARDIOGRAM (TEE);  Surgeon: Lajuana Matte, MD;  Location: [facility];  Service: Open Heart Surgery;  Laterality: N/A;    Current Medications: Current Meds  Medication Sig    amiodarone (PACERONE) 400 MG tablet Take 1 tablet (400 mg total) by mouth 2 (two) times daily. For 7 days, then 400 mg once daily   aspirin EC 325 MG EC tablet Take 1 tablet (325 mg total) by mouth daily.   Coenzyme Q10 100 MG capsule Take 100 mg by mouth daily.   ferrous [id number] C-folic acid (TRINSICON / FOLTRIN) capsule Take 1 capsule by mouth 2 (two) times daily after a meal.   metoprolol succinate (TOPROL XL) 25 MG 24 hr tablet Take 0.5 tablets (12.5 mg total) by mouth daily.   Multiple Vitamin (ONE-DAILY MULTI-VITAMIN PO) Take 1 tablet by mouth daily.   omeprazole (PRILOSEC) 20 MG capsule Take 20 mg by mouth daily.   oxyCODONE (OXY IR/ROXICODONE) 5 MG immediate release tablet Take 1 tablet (5 mg total) by mouth every 6 (six) hours as needed for severe pain.   rosuvastatin (CRESTOR) 20 MG tablet Take 1 tablet (20 mg total) by mouth daily.   triamcinolone cream (KENALOG) 0.1 %  Apply 1 application. topically 2 (two) times daily.     Allergies:   Oxycodone and Statins   Social History   Socioeconomic History   Marital status: Married    Spouse name: Not on file   Number of children: Not on file   Years of education: Not on file   Highest education level: Not on file  Occupational History   Not on file  Tobacco Use   Smoking status: Never    Passive exposure: Never   Smokeless tobacco: Never  Vaping Use   Vaping Use: Never used  Substance and Sexual Activity   Alcohol use: Not Currently   Drug use: Never   Sexual activity: Not on file  Other Topics Concern   Not on file  Social History Narrative   Not on file   Social Determinants of Health   Financial Resource Strain: Not on file  Food Insecurity: Not on file  Transportation Needs: Not on file  Physical Activity: Not on file  Stress: Not on file  Social Connections: Not on file     Family History: The patient's family history includes Cancer in his mother; Diabetes in his brother; Heart attack in his  father; Heart disease in his brother. ROS:   Please see the history of present illness.    All other systems reviewed and are negative.  EKGs/Labs/Other Studies Reviewed:    The following studies were reviewed today:  EKG:  EKG ordered today and personally reviewed.  The ekg ordered today demonstrates sinus rhythm right bundle branch block  Recent Labs: 05/13/2021: ALT 19 05/18/2021: Magnesium 2.5 05/20/2021: BUN 20; Creatinine, Ser 0.97; Hemoglobin 7.1; Platelets 145; Potassium 4.1; Sodium 139  Recent Lipid Panel    Component Value Date/Time   CHOL 253 (H) 04/15/2021 1029   TRIG 130 04/15/2021 1029   HDL 46 04/15/2021 1029   CHOLHDL 5.5 (H) 04/15/2021 1029   LDLCALC 183 (H) 04/15/2021 1029    Physical Exam:    VS:  BP 104/60   Pulse 82   Ht '5\' 10"'$  (1.778 m)   Wt 160 lb 12.8 oz (72.9 kg)   SpO2 97%   BMI 23.07 kg/m     Wt Readings from Last 3 Encounters:  06/07/21 160 lb 12.8 oz (72.9 kg)  05/21/21 164 lb 14.4 oz (74.8 kg)  05/13/21 168 lb 11.2 oz (76.5 kg)     GEN: Appears his age well nourished, well developed in no acute distress HEENT: Normal NECK: No JVD; No carotid bruits LYMPHATICS: No lymphadenopathy CARDIAC: Sternal wounds are all clean no signs of inflammation drainage and the sternum has no click RRR, no murmurs, rubs, gallops RESPIRATORY:  Clear to auscultation without rales, wheezing or rhonchi  ABDOMEN: Soft, non-tender, non-distended MUSCULOSKELETAL:  No edema; No deformity lower extremity venous vein stripping sites all clean no evidence of infection SKIN: Warm and dry NEUROLOGIC:  Alert and oriented x 3 PSYCHIATRIC:  Normal affect    Signed, Shirlee More, MD  06/07/2021 3:45 PM     [facility]

## 2021-06-06 NOTE — Progress Notes (Signed)
Patient arrived for nurse visit to remove remaining staples post- procedure CABG with Dr. Kipp Brood.  Remaining staples removed from bilateral anterior thigh/groin with no signs/ symptoms of infection noted.  Patient tolerated procedure well.  Patient/ family instructed to keep the incision sites clean and dry.  Patient/ family acknowledged instructions given.

## 2021-06-07 ENCOUNTER — Ambulatory Visit: Payer: Medicare HMO | Admitting: Cardiology

## 2021-06-07 ENCOUNTER — Encounter: Payer: Self-pay | Admitting: Cardiology

## 2021-06-07 VITALS — BP 104/60 | HR 82 | Ht 70.0 in | Wt 160.8 lb

## 2021-06-07 DIAGNOSIS — Z79899 Other long term (current) drug therapy: Secondary | ICD-10-CM | POA: Diagnosis not present

## 2021-06-07 DIAGNOSIS — I25118 Atherosclerotic heart disease of native coronary artery with other forms of angina pectoris: Secondary | ICD-10-CM | POA: Diagnosis not present

## 2021-06-07 DIAGNOSIS — I4510 Unspecified right bundle-branch block: Secondary | ICD-10-CM | POA: Diagnosis not present

## 2021-06-07 DIAGNOSIS — E782 Mixed hyperlipidemia: Secondary | ICD-10-CM

## 2021-06-07 DIAGNOSIS — I480 Paroxysmal atrial fibrillation: Secondary | ICD-10-CM | POA: Diagnosis not present

## 2021-06-07 MED ORDER — AMIODARONE HCL 400 MG PO TABS
400.0000 mg | ORAL_TABLET | Freq: Every day | ORAL | 3 refills | Status: DC
Start: 2021-06-07 — End: 2021-07-16

## 2021-06-07 NOTE — Patient Instructions (Signed)
Medication Instructions:  Your physician has recommended you make the following change in your medication:   START:  Amiodarone 400 mg daily  *If you need a refill on your cardiac medications before your next appointment, please call your pharmacy*   Lab Work: None If you have labs (blood work) drawn today and your tests are completely normal, you will receive your results only by: [place] (if you have MyChart) OR A paper copy in the mail If you have any lab test that is abnormal or we need to change your treatment, we will call you to review the results.   Testing/Procedures: None   Follow-Up: At [facility], you and your health needs are our priority.  As part of our continuing mission to provide you with exceptional heart care, we have created designated Provider Care Teams.  These Care Teams include your primary Cardiologist (physician) and Advanced Practice Providers (APPs -  Physician Assistants and Nurse Practitioners) who all work together to provide you with the care you need, when you need it.  We recommend signing up for the patient portal called "MyChart".  Sign up information is provided on this After Visit Summary.  MyChart is used to connect with patients for Virtual Visits (Telemedicine).  Patients are able to view lab/test results, encounter notes, upcoming appointments, etc.  Non-urgent messages can be sent to your provider as well.   To learn more about what you can do with MyChart, go to [email or web address].    Your next appointment:   4 week(s)  The format for your next appointment:   In Person  Provider:   Shirlee More, MD    Other Instructions None  Important Information About Sugar

## 2021-06-07 NOTE — Addendum Note (Signed)
Addended by: Edwyna Shell I on: 06/07/2021 04:03 PM   Modules accepted: Orders

## 2021-06-11 ENCOUNTER — Ambulatory Visit: Payer: Medicare HMO | Admitting: Cardiology

## 2021-06-18 ENCOUNTER — Telehealth: Payer: Self-pay

## 2021-06-18 ENCOUNTER — Telehealth: Payer: Self-pay | Admitting: Cardiology

## 2021-06-18 NOTE — Telephone Encounter (Signed)
Wife calling to say patient is experiencing  pain in  his lower back/hip area. States that she has giving him tylenol but wasn't sure if it heart related. Please advise

## 2021-06-18 NOTE — Telephone Encounter (Signed)
Spoke with pts spouse per DPR. She stated that the pt was having some low back pain, hip pain. She stated that he hd stood too long at a funeral. She stated that she was giving him Tylenol and using a heating pad. She denied pt having any chest pain, shortness of breath, dizziness, weakness. Encouraged her to alternate heat and ice and to get an appt with his PCP if his symptoms persist. Pts spouse agreed and verbalized understanding. She had no other questions or concerns.

## 2021-06-24 ENCOUNTER — Other Ambulatory Visit: Payer: Self-pay | Admitting: Thoracic Surgery (Cardiothoracic Vascular Surgery)

## 2021-06-24 DIAGNOSIS — Z951 Presence of aortocoronary bypass graft: Secondary | ICD-10-CM

## 2021-06-25 ENCOUNTER — Ambulatory Visit (INDEPENDENT_AMBULATORY_CARE_PROVIDER_SITE_OTHER): Payer: Self-pay | Admitting: Physician Assistant

## 2021-06-25 ENCOUNTER — Ambulatory Visit
Admission: RE | Admit: 2021-06-25 | Discharge: 2021-06-25 | Disposition: A | Discharge status: Home / Self Care | Payer: Medicare HMO | Source: Ambulatory Visit | Attending: Thoracic Surgery (Cardiothoracic Vascular Surgery) | Admitting: Thoracic Surgery (Cardiothoracic Vascular Surgery)

## 2021-06-25 VITALS — BP 100/62 | HR 79 | Resp 20 | Ht 70.0 in | Wt 161.0 lb

## 2021-06-25 DIAGNOSIS — I2510 Atherosclerotic heart disease of native coronary artery without angina pectoris: Secondary | ICD-10-CM

## 2021-06-25 DIAGNOSIS — Z951 Presence of aortocoronary bypass graft: Secondary | ICD-10-CM

## 2021-06-25 NOTE — Patient Instructions (Signed)

## 2021-06-25 NOTE — Progress Notes (Signed)
      [address]       ,[place] [address]             [phone]    HPI: Patient returns for routine postoperative follow-up having undergone CABG x 3 on 05/15/2021.  The patient's early postoperative recovery while in the hospital was notable for development of Atrial Fibrillation with RVR. Since hospital discharge the patient reports he is doing very well.  He does not have any pain.  He is ambulating without difficulty several times per day.  He is ready to start cardiac rehab.  His appetite has improved but he has lost some weight.  His surgical incisions are healing without difficulty.  Current Outpatient Medications  Medication Sig Dispense Refill   amiodarone (PACERONE) 400 MG tablet Take 1 tablet (400 mg total) by mouth daily. 90 tablet 3   aspirin EC 325 MG EC tablet Take 1 tablet (325 mg total) by mouth daily. 30 tablet 0   Coenzyme Q10 100 MG capsule Take 100 mg by mouth daily.     ferrous [id number] C-folic acid (TRINSICON / FOLTRIN) capsule Take 1 capsule by mouth 2 (two) times daily after a meal. 60 capsule 1   metoprolol succinate (TOPROL XL) 25 MG 24 hr tablet Take 0.5 tablets (12.5 mg total) by mouth daily. 45 tablet 3   Multiple Vitamin (ONE-DAILY MULTI-VITAMIN PO) Take 1 tablet by mouth daily.     omeprazole (PRILOSEC) 20 MG capsule Take 20 mg by mouth daily.     oxyCODONE (OXY IR/ROXICODONE) 5 MG immediate release tablet Take 1 tablet (5 mg total) by mouth every 6 (six) hours as needed for severe pain. 28 tablet 0   rosuvastatin (CRESTOR) 20 MG tablet Take 1 tablet (20 mg total) by mouth daily. 90 tablet 3   triamcinolone cream (KENALOG) 0.1 % Apply 1 application. topically 2 (two) times daily.     No current facility-administered medications for this visit.    Physical Exam:  BP 100/62   Pulse 79   Resp 20   Ht '5\' 10"'$  (1.778 m)   Wt 161 lb (73 kg)   SpO2 99% Comment: RA  BMI 23.10 kg/m   Gen: NAD Heart: RRR Lungs: CTA  bilaterally Ext: no edema Incisions: well healed  Diagnostic Tests:  CXR: sternal wires intact, no pleural effusions present    A/P:  S/P CABG x 3  performed 5/10 Atrial Fibrillation- maintaining NSR since discharge, patient has been followed by Dr. Bettina Gavia who is tapering his Amiodarone Cardiac Rehabilitation- okay to enroll if you wish to participate Activity- increase ambulation as tolerated, okay to resume driving if you are no longer taking narcotic pain medication, continue sternal precautions as discussed RTC prn  Ellwood Handler, PA-C [facility] [phone]

## 2021-07-08 ENCOUNTER — Other Ambulatory Visit: Payer: Self-pay | Admitting: Cardiology

## 2021-07-08 ENCOUNTER — Other Ambulatory Visit: Payer: Self-pay | Admitting: Surgical

## 2021-07-11 ENCOUNTER — Other Ambulatory Visit: Payer: Self-pay | Admitting: Cardiology

## 2021-07-11 DIAGNOSIS — I209 Angina pectoris, unspecified: Secondary | ICD-10-CM | POA: Diagnosis not present

## 2021-07-11 DIAGNOSIS — E785 Hyperlipidemia, unspecified: Secondary | ICD-10-CM | POA: Diagnosis not present

## 2021-07-11 DIAGNOSIS — I351 Nonrheumatic aortic (valve) insufficiency: Secondary | ICD-10-CM | POA: Diagnosis not present

## 2021-07-11 DIAGNOSIS — I4510 Unspecified right bundle-branch block: Secondary | ICD-10-CM | POA: Diagnosis not present

## 2021-07-11 DIAGNOSIS — Z951 Presence of aortocoronary bypass graft: Secondary | ICD-10-CM | POA: Diagnosis not present

## 2021-07-12 DIAGNOSIS — I4510 Unspecified right bundle-branch block: Secondary | ICD-10-CM | POA: Diagnosis not present

## 2021-07-12 DIAGNOSIS — I209 Angina pectoris, unspecified: Secondary | ICD-10-CM | POA: Diagnosis not present

## 2021-07-12 DIAGNOSIS — E785 Hyperlipidemia, unspecified: Secondary | ICD-10-CM | POA: Diagnosis not present

## 2021-07-12 DIAGNOSIS — I351 Nonrheumatic aortic (valve) insufficiency: Secondary | ICD-10-CM | POA: Diagnosis not present

## 2021-07-12 DIAGNOSIS — Z951 Presence of aortocoronary bypass graft: Secondary | ICD-10-CM | POA: Diagnosis not present

## 2021-07-15 NOTE — Progress Notes (Unsigned)
Cardiology Office Note:    Date:  07/16/2021   ID:  Derek Wood, DOB 08-02-1945, MRN [id number]  PCP:  Street, [place], MD  Cardiologist:  Shirlee More, MD    Referring MD: [address], Sharon Mt, *    ASSESSMENT:    1. Coronary artery disease of native artery of native heart with stable angina pectoris (HCC)   2. Paroxysmal atrial fibrillation ([place])   3. On amiodarone therapy   4. Mixed hyperlipidemia    PLAN:    In order of problems listed above:  He continues to improve after bypass surgery continue aspirin beta-blocker and statin and cardiac rehabilitation No clinical recurrence stop amiodarone screen heart rhythm daily with his mobile device Continue statin check CMP and lipid profile   Next appointment: 4 months   Medication Adjustments/Labs and Tests Ordered: Current medicines are reviewed at length with the patient today.  Concerns regarding medicines are outlined above.  Orders Placed This Encounter  Procedures   CBC   Comp Met (CMET)   Lipid Profile   No orders of the defined types were placed in this encounter.   Chief Complaint  Patient presents with   Follow-up   Coronary Artery Disease    History of Present Illness:    Derek Wood is a 76 y.o. male with a hx of coronary artery disease cardiac CTA showing three-vessel severe coronary artery disease seen 05/03/2021 and referred to coronary angiography.  Left heart cath confirmed multivessel CAD and 70% left main coronary artery stenosis he was admitted to the hospital underwent inpatient CABG 05/15/2021.  Postoperatively he required brief inotropic support with Levophed postoperative day 2 developed atrial fibrillation with rapid rate treated with IV amiodarone and converted to sinus rhythm.  He was last seen 06/07/2021 maintaining sinus rhythm on low-dose amiodarone. Compliance with diet, lifestyle and medications: Yes  [name] continues to improve and is starting cardiac rehabilitation he has a  little bit of dysesthesia in the left chest from stripping of the thoracic artery but no angina edema shortness of breath palpitation or syncope We have the mobile cardia device and will check his heart rhythm daily he has had no recurrent atrial fibrillation we will stop amiodarone today We will transition to over-the-counter iron supplements. 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    Past Surgical History:  Procedure Laterality Date   COLONOSCOPY  08/2016   CORONARY ARTERY BYPASS GRAFT N/A 05/15/2021   Procedure: CORONARY ARTERY BYPASS GRAFTING (CABG) TIMES THREE   USING LEFT INTERNAL MAMMARY ARTERY AND RIGHT AND LEFT SAPHENOUS VEIN;  Surgeon: Lajuana Matte, MD;  Location: [place];  Service: Open Heart Surgery;  Laterality: N/A;   CYSTECTOMY  04/2021   Upper right quadrant of chest   LEFT HEART CATH AND CORONARY ANGIOGRAPHY N/A 05/08/2021   Procedure: LEFT HEART CATH AND CORONARY ANGIOGRAPHY;  Surgeon: Burnell Blanks, MD;  Location: [place] [facility];  Service: Cardiovascular;  Laterality: N/A;   ROTATOR CUFF REPAIR Right 02/2015   TEE WITHOUT CARDIOVERSION N/A 05/15/2021   Procedure: TRANSESOPHAGEAL ECHOCARDIOGRAM (TEE);  Surgeon: Lajuana Matte,  MD;  [facility];  Service: Open Heart Surgery;  Laterality: N/A;    Current Medications: Current Meds  Medication Sig   acetaminophen (TYLENOL) 325 MG tablet Take 650 mg by mouth every 6 (six) hours as needed.   aspirin EC 325 MG EC tablet Take 1 tablet (325 mg total) by mouth daily.   Coenzyme Q10 100 MG capsule Take 100 mg by mouth daily.   ferrous  [id number] C-folic acid (TRINSICON / FOLTRIN) capsule Take 1 capsule by mouth 2 (two) times daily after a meal.   metoprolol succinate (TOPROL XL) 25 MG 24 hr tablet Take 0.5 tablets (12.5 mg total) by mouth daily.   Multiple Vitamin (ONE-DAILY MULTI-VITAMIN PO) Take 1 tablet by mouth daily.   omeprazole (PRILOSEC) 20 MG capsule Take 20 mg by mouth daily.   rosuvastatin (CRESTOR) 20 MG tablet Take 1 tablet (20 mg total) by mouth daily.   [DISCONTINUED] amiodarone (PACERONE) 200 MG tablet Take 200 mg by mouth daily.     Allergies:   Oxycodone and Statins   Social History   Socioeconomic History   Marital status: Married    Spouse name: Not on file   Number of children: Not on file   Years of education: Not on file   Highest education level: Not on file  Occupational History   Not on file  Tobacco Use   Smoking status: Never    Passive exposure: Never   Smokeless tobacco: Never  Vaping Use   Vaping Use: Never used  Substance and Sexual Activity   Alcohol use: Not Currently   Drug use: Never   Sexual activity: Not on file  Other Topics Concern   Not on file  Social History Narrative   Not on file   Social Determinants of Health   Financial Resource Strain: Not on file  Food Insecurity: Not on file  Transportation Needs: Not on file  Physical Activity: Not on file  Stress: Not on file  Social Connections: Not on file     Family History: The patient's family history includes Cancer in his mother; Diabetes in his brother; Heart attack in his father; Heart disease in his brother. ROS:   Please see the history of present illness.    All other systems reviewed and are negative.  EKGs/Labs/Other Studies Reviewed:    The following studies were reviewed today:   Recent Labs: 05/13/2021: ALT 19 05/18/2021: Magnesium 2.5 05/20/2021: BUN 20; Creatinine, Ser 0.97; Hemoglobin 7.1; Platelets 145; Potassium 4.1; Sodium 139  Recent Lipid Panel    Component Value  Date/Time   CHOL 253 (H) 04/15/2021 1029   TRIG 130 04/15/2021 1029   HDL 46 04/15/2021 1029   CHOLHDL 5.5 (H) 04/15/2021 1029   LDLCALC 183 (H) 04/15/2021 1029    Physical Exam:    VS:  BP 110/60 (BP Location: Right Arm, Patient Position: Sitting, Cuff Size: Normal)   Pulse 74   Ht 5' 10" (1.778 m)   Wt 165 lb (74.8 kg)   SpO2 96%   BMI 23.68 kg/m     Wt Readings from Last 3 Encounters:  07/16/21 165 lb (74.8 kg)  06/25/21 161 lb (73 kg)  06/07/21 160 lb 12.8 oz (72.9 kg)     GEN:  Well nourished, well developed in no acute distress HEENT: Normal NECK: No JVD; No carotid bruits LYMPHATICS: No lymphadenopathy CARDIAC: RRR, no murmurs, rubs, gallops RESPIRATORY:  Clear to auscultation without rales, wheezing or rhonchi  ABDOMEN: Soft, non-tender, non-distended  MUSCULOSKELETAL:  No edema; No deformity  SKIN: Warm and dry NEUROLOGIC:  Alert and oriented x 3 PSYCHIATRIC:  Normal affect    Signed, Shirlee More, MD  07/16/2021 1:45 PM    [facility]

## 2021-07-16 ENCOUNTER — Encounter: Payer: Self-pay | Admitting: Cardiology

## 2021-07-16 ENCOUNTER — Ambulatory Visit: Payer: Medicare HMO | Admitting: Cardiology

## 2021-07-16 VITALS — BP 110/60 | HR 74 | Ht 70.0 in | Wt 165.0 lb

## 2021-07-16 DIAGNOSIS — I25118 Atherosclerotic heart disease of native coronary artery with other forms of angina pectoris: Secondary | ICD-10-CM

## 2021-07-16 DIAGNOSIS — I480 Paroxysmal atrial fibrillation: Secondary | ICD-10-CM | POA: Diagnosis not present

## 2021-07-16 DIAGNOSIS — E782 Mixed hyperlipidemia: Secondary | ICD-10-CM | POA: Diagnosis not present

## 2021-07-16 DIAGNOSIS — Z79899 Other long term (current) drug therapy: Secondary | ICD-10-CM | POA: Diagnosis not present

## 2021-07-16 NOTE — Patient Instructions (Signed)
Medication Instructions:  Your physician has recommended you make the following change in your medication:   STOP: Amiodarone  *If you need a refill on your cardiac medications before your next appointment, please call your pharmacy*   Lab Work: Your physician recommends that you return for lab work in:   Labs today: CBC, CMP, Lipids  If you have labs (blood work) drawn today and your tests are completely normal, you will receive your results only by: MyChart Message (if you have [place]) OR A paper copy in the mail If you have any lab test that is abnormal or we need to change your treatment, we will call you to review the results.   Testing/Procedures: None   Follow-Up: At [facility], you and your health needs are our priority.  As part of our continuing mission to provide you with exceptional heart care, we have created designated Provider Care Teams.  These Care Teams include your primary Cardiologist (physician) and Advanced Practice Providers (APPs -  Physician Assistants and Nurse Practitioners) who all work together to provide you with the care you need, when you need it.  We recommend signing up for the patient portal called "MyChart".  Sign up information is provided on this After Visit Summary.  MyChart is used to connect with patients for Virtual Visits (Telemedicine).  Patients are able to view lab/test results, encounter notes, upcoming appointments, etc.  Non-urgent messages can be sent to your provider as well.   To learn more about what you can do with MyChart, go to [email or web address].    Your next appointment:   3 month(s)  The format for your next appointment:   In Person  Provider:   Shirlee More, MD    Other Instructions Take a children's chewable vitamin with iron.  Check mobile kardia daily and send by MyChart if A-fib.    Important Information About Sugar

## 2021-07-17 LAB — CBC
Hematocrit: 41.7 % (ref 37.5–51.0)
Hemoglobin: 13.6 g/dL (ref 13.0–17.7)
MCH: 28.2 pg (ref 26.6–33.0)
MCHC: 32.6 g/dL (ref 31.5–35.7)
MCV: 86 fL (ref 79–97)
Platelets: 169 10*3/uL (ref 150–450)
RBC: 4.83 x10E6/uL (ref 4.14–5.80)
RDW: 12.4 % (ref 11.6–15.4)
WBC: 7 10*3/uL (ref 3.4–10.8)

## 2021-07-17 LAB — COMPREHENSIVE METABOLIC PANEL WITH GFR
ALT: 16 IU/L (ref 0–44)
AST: 19 IU/L (ref 0–40)
Albumin/Globulin Ratio: 1.6 (ref 1.2–2.2)
Albumin: 4.2 g/dL (ref 3.8–4.8)
Alkaline Phosphatase: 104 IU/L (ref 44–121)
BUN/Creatinine Ratio: 26 — ABNORMAL HIGH (ref 10–24)
BUN: 23 mg/dL (ref 8–27)
Bilirubin Total: <0.2 mg/dL (ref 0.0–1.2)
CO2: 23 mmol/L (ref 20–29)
Calcium: 9.4 mg/dL (ref 8.6–10.2)
Chloride: 102 mmol/L (ref 96–106)
Creatinine, Ser: 0.9 mg/dL (ref 0.76–1.27)
Globulin, Total: 2.6 g/dL (ref 1.5–4.5)
Glucose: 99 mg/dL (ref 70–99)
Potassium: 4.1 mmol/L (ref 3.5–5.2)
Sodium: 141 mmol/L (ref 134–144)
Total Protein: 6.8 g/dL (ref 6.0–8.5)
eGFR: 87 mL/min/1.73

## 2021-07-17 LAB — LIPID PANEL
Chol/HDL Ratio: 3.4 ratio (ref 0.0–5.0)
Cholesterol, Total: 141 mg/dL (ref 100–199)
HDL: 41 mg/dL (ref >39)
LDL Chol Calc (NIH): 71 mg/dL (ref 0–99)
Triglycerides: 171 mg/dL — ABNORMAL HIGH (ref 0–149)
VLDL Cholesterol Cal: 29 mg/dL (ref 5–40)

## 2021-08-12 ENCOUNTER — Ambulatory Visit: Payer: Medicare HMO | Admitting: Cardiology

## 2021-10-16 ENCOUNTER — Ambulatory Visit: Payer: Medicare HMO | Attending: Cardiology | Admitting: Cardiology

## 2021-10-16 ENCOUNTER — Encounter: Payer: Self-pay | Admitting: Cardiology

## 2021-10-16 VITALS — BP 102/60 | HR 72 | Ht 70.0 in | Wt 170.0 lb

## 2021-10-16 DIAGNOSIS — E782 Mixed hyperlipidemia: Secondary | ICD-10-CM | POA: Diagnosis not present

## 2021-10-16 DIAGNOSIS — I4510 Unspecified right bundle-branch block: Secondary | ICD-10-CM | POA: Diagnosis not present

## 2021-10-16 DIAGNOSIS — I480 Paroxysmal atrial fibrillation: Secondary | ICD-10-CM

## 2021-10-16 DIAGNOSIS — I25118 Atherosclerotic heart disease of native coronary artery with other forms of angina pectoris: Secondary | ICD-10-CM

## 2021-10-16 NOTE — Progress Notes (Signed)
Cardiology Office Note:    Date:  10/16/2021   ID:  Derek Wood, DOB [birth year], MRN [id number]  PCP:  Street, Derek Mt, MD  Cardiologist:  Derek More, MD    Referring MD: [address] Derek Wood, *    ASSESSMENT:    1. Paroxysmal atrial fibrillation (HCC)   2. Coronary artery disease of native artery of native heart with stable angina pectoris ([place])   3. Mixed hyperlipidemia   4. Right bundle branch block    PLAN:    In order of problems listed above:  Fortunately no recurrence of atrial fibrillation off amiodarone no longer anticoagulated he agrees to purchase a smart watch to monitor for recurrence sign up for MyChart and can send strips if documented. Stay able CAD following CABG has made a good recovery continue his medical therapy with aspirin beta-blocker and high intensity statin We will recheck his lipid profile today Stay well EKG pattern   Next appointment: 6 months   Medication Adjustments/Labs and Tests Ordered: Current medicines are reviewed at length with the patient today.  Concerns regarding medicines are outlined above.  No orders of the defined types were placed in this encounter.  No orders of the defined types were placed in this encounter.   Chief Complaint  Patient presents with   Follow-up    History of Present Illness:    Derek Wood is a 76 y.o. male with a hx of coronary artery disease cardiac CTA showing three-vessel severe coronary artery disease seen 05/03/2021 and referred to coronary angiography.  Left heart cath confirmed multivessel CAD and 70% left main coronary artery stenosis he was admitted to the hospital underwent inpatient CABG 05/15/2021.  Postoperatively he required brief inotropic support with Levophed postoperative day 2 developed atrial fibrillation with rapid rate treated with IV amiodarone and converted to sinus rhythm  last seen 07/16/2021 and amiodarone was discontinued 07/16/2021  Compliance with diet, lifestyle  and medications: Yes  He has finally recovered back to full activities unfortunately never bought a smart watch. He worries to get a Fitbit he will track for recurrent atrial fibrillation and also steps goal 8500 today Off amiodarone no palpitations syncope edema shortness of breath chest pain. 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    Past Surgical History:  Procedure Laterality Date   COLONOSCOPY  08/2016   CORONARY ARTERY BYPASS GRAFT N/A 05/15/2021   Procedure: CORONARY ARTERY BYPASS GRAFTING (CABG) TIMES THREE   USING LEFT INTERNAL MAMMARY ARTERY AND RIGHT AND LEFT SAPHENOUS VEIN;  Surgeon: Derek Matte, MD;  Location: [place];  Service: Open Heart Surgery;  Laterality: N/A;   CYSTECTOMY  04/2021   Upper right quadrant of chest   LEFT HEART CATH AND CORONARY ANGIOGRAPHY N/A 05/08/2021   Procedure: LEFT HEART CATH AND CORONARY ANGIOGRAPHY;  Surgeon: Derek Blanks, MD;  Location: [place] [facility];  Service: Cardiovascular;  Laterality: N/A;   ROTATOR CUFF REPAIR Right 02/2015   TEE WITHOUT CARDIOVERSION N/A 05/15/2021   Procedure: TRANSESOPHAGEAL ECHOCARDIOGRAM (TEE);  Surgeon: Derek Matte,  MD;  [facility];  Service: Open Heart Surgery;  Laterality: N/A;    Current Medications: Current Meds  Medication Sig   acetaminophen (TYLENOL) 325 MG tablet Take 650 mg by mouth every 6 (six) hours as needed.   aspirin EC 325 MG EC tablet Take 1 tablet (325 mg total) by mouth daily.   Coenzyme Q10 100 MG capsule Take 100 mg by mouth daily.   metoprolol succinate  (TOPROL XL) 25 MG 24 hr tablet Take 0.5 tablets (12.5 mg total) by mouth daily.   Multiple Vitamin (ONE-DAILY MULTI-VITAMIN PO) Take 1 tablet by mouth daily.   omeprazole (PRILOSEC) 20 MG capsule Take 20 mg by mouth daily.   rosuvastatin (CRESTOR) 20 MG tablet Take 1 tablet (20 mg total) by mouth daily.     Allergies:   Oxycodone and Statins   Social History   Socioeconomic History   Marital status: Married    Spouse name: Not on file   Number of children: Not on file   Years of education: Not on file   Highest education level: Not on file  Occupational History   Not on file  Tobacco Use   Smoking status: Never    Passive exposure: Never   Smokeless tobacco: Never  Vaping Use   Vaping Use: Never used  Substance and Sexual Activity   Alcohol use: Not Currently   Drug use: Never   Sexual activity: Not on file  Other Topics Concern   Not on file  Social History Narrative   Not on file   Social Determinants of Health   Financial Resource Strain: Not on file  Food Insecurity: Not on file  Transportation Needs: Not on file  Physical Activity: Not on file  Stress: Not on file  Social Connections: Not on file     Family History: The patient's family history includes Cancer in his mother; Diabetes in his brother; Heart attack in his father; Heart disease in his brother. ROS:   Please see the history of present illness.    All other systems reviewed and are negative.  EKGs/Labs/Other Studies Reviewed:    The following studies were reviewed today:  EKG:  EKG ordered today and personally reviewed.  The ekg ordered today demonstrates sinus rhythm right bundle branch block  Recent Labs: 05/18/2021: Magnesium 2.5 07/16/2021: ALT 16; BUN 23; Creatinine, Ser 0.90; Hemoglobin 13.6; Platelets 169; Potassium 4.1; Sodium 141  Recent Lipid Panel    Component Value Date/Time   CHOL 141 07/16/2021 1348   TRIG 171 (H) 07/16/2021 1348   HDL 41 07/16/2021 1348   CHOLHDL 3.4  07/16/2021 1348   LDLCALC 71 07/16/2021 1348    Physical Exam:    VS:  BP 102/60 (BP Location: Right Arm, Patient Position: Sitting, Cuff Size: Normal)   Pulse 72   Ht '5\' 10"'$  (1.778 m)   Wt 170 lb (77.1 kg)   SpO2 99%   BMI 24.39 kg/m     Wt Readings from Last 3 Encounters:  10/16/21 170 lb (77.1 kg)  07/16/21 165 lb (74.8 kg)  06/25/21 161 lb (73 kg)     GEN: Looks healthy well nourished, well developed in no acute distress HEENT: Normal NECK: No JVD; No carotid bruits LYMPHATICS: No lymphadenopathy CARDIAC: RRR, no murmurs, rubs, gallops RESPIRATORY:  Clear to auscultation without rales, wheezing or rhonchi  ABDOMEN: Soft, non-tender, non-distended MUSCULOSKELETAL:  No edema; No deformity  SKIN: Warm and dry NEUROLOGIC:  Alert and oriented x 3 PSYCHIATRIC:  Normal affect    Signed, Derek More, MD  10/16/2021 10:15 AM    [place]

## 2021-10-16 NOTE — Patient Instructions (Addendum)
Medication Instructions:  Your physician recommends that you continue on your current medications as directed. Please refer to the Current Medication list given to you today.  *If you need a refill on your cardiac medications before your next appointment, please call your pharmacy*   Lab Work: Your physician recommends that you return for lab work in: Today for lipid panel  If you have labs (blood work) drawn today and your tests are completely normal, you will receive your results only by: MyChart Message (if you have MyChart) OR A paper copy in the mail If you have any lab test that is abnormal or we need to change your treatment, we will call you to review the results.   Testing/Procedures: NONE   Follow-Up: At [facility], you and your health needs are our priority.  As part of our continuing mission to provide you with exceptional heart care, we have created designated Provider Care Teams.  These Care Teams include your primary Cardiologist (physician) and Advanced Practice Providers (APPs -  Physician Assistants and Nurse Practitioners) who all work together to provide you with the care you need, when you need it.  We recommend signing up for the patient portal called "MyChart".  Sign up information is provided on this After Visit Summary.  MyChart is used to connect with patients for Virtual Visits (Telemedicine).  Patients are able to view lab/test results, encounter notes, upcoming appointments, etc.  Non-urgent messages can be sent to your provider as well.   To learn more about what you can do with MyChart, go to [email or web address].    Your next appointment:   6 month(s)  The format for your next appointment:   In Person  Provider:   Shirlee More, MD    Other Instructions [place] and start walking. Your goal is 8500 steps per day  Important Information About Sugar

## 2021-10-17 LAB — LIPID PANEL
Chol/HDL Ratio: 3.4 ratio (ref 0.0–5.0)
Cholesterol, Total: 150 mg/dL (ref 100–199)
HDL: 44 mg/dL (ref >39)
LDL Chol Calc (NIH): 88 mg/dL (ref 0–99)
Triglycerides: 95 mg/dL (ref 0–149)
VLDL Cholesterol Cal: 18 mg/dL (ref 5–40)

## 2021-10-22 ENCOUNTER — Telehealth: Payer: Self-pay

## 2021-10-22 NOTE — Telephone Encounter (Signed)
-----   Message from Richardo Priest, MD sent at 10/17/2021  7:58 AM EDT ----- Normal or stable result  No changes

## 2021-10-22 NOTE — Telephone Encounter (Signed)
Spoke with patient wife, notified of results

## 2021-11-21 DIAGNOSIS — G8929 Other chronic pain: Secondary | ICD-10-CM | POA: Diagnosis not present

## 2021-11-21 DIAGNOSIS — M25512 Pain in left shoulder: Secondary | ICD-10-CM | POA: Diagnosis not present

## 2022-01-08 DIAGNOSIS — M75102 Unspecified rotator cuff tear or rupture of left shoulder, not specified as traumatic: Secondary | ICD-10-CM | POA: Diagnosis not present

## 2022-01-15 DIAGNOSIS — S43402A Unspecified sprain of left shoulder joint, initial encounter: Secondary | ICD-10-CM | POA: Diagnosis not present

## 2022-01-15 DIAGNOSIS — M6258 Muscle wasting and atrophy, not elsewhere classified, other site: Secondary | ICD-10-CM | POA: Diagnosis not present

## 2022-01-15 DIAGNOSIS — M75102 Unspecified rotator cuff tear or rupture of left shoulder, not specified as traumatic: Secondary | ICD-10-CM | POA: Diagnosis not present

## 2022-01-15 DIAGNOSIS — S46012A Strain of muscle(s) and tendon(s) of the rotator cuff of left shoulder, initial encounter: Secondary | ICD-10-CM | POA: Diagnosis not present

## 2022-01-15 DIAGNOSIS — M79602 Pain in left arm: Secondary | ICD-10-CM | POA: Diagnosis not present

## 2022-01-15 DIAGNOSIS — M19012 Primary osteoarthritis, left shoulder: Secondary | ICD-10-CM | POA: Diagnosis not present

## 2022-01-17 DIAGNOSIS — S46212S Strain of muscle, fascia and tendon of other parts of biceps, left arm, sequela: Secondary | ICD-10-CM | POA: Diagnosis not present

## 2022-01-17 DIAGNOSIS — M75102 Unspecified rotator cuff tear or rupture of left shoulder, not specified as traumatic: Secondary | ICD-10-CM | POA: Diagnosis not present

## 2022-01-17 DIAGNOSIS — M12812 Other specific arthropathies, not elsewhere classified, left shoulder: Secondary | ICD-10-CM | POA: Diagnosis not present

## 2022-01-24 DIAGNOSIS — Z2821 Immunization not carried out because of patient refusal: Secondary | ICD-10-CM | POA: Diagnosis not present

## 2022-01-24 DIAGNOSIS — Z6825 Body mass index (BMI) 25.0-25.9, adult: Secondary | ICD-10-CM | POA: Diagnosis not present

## 2022-01-24 DIAGNOSIS — Z01818 Encounter for other preprocedural examination: Secondary | ICD-10-CM | POA: Diagnosis not present

## 2022-01-24 DIAGNOSIS — Z0000 Encounter for general adult medical examination without abnormal findings: Secondary | ICD-10-CM | POA: Diagnosis not present

## 2022-01-24 DIAGNOSIS — I25119 Atherosclerotic heart disease of native coronary artery with unspecified angina pectoris: Secondary | ICD-10-CM | POA: Diagnosis not present

## 2022-01-24 DIAGNOSIS — K2960 Other gastritis without bleeding: Secondary | ICD-10-CM | POA: Diagnosis not present

## 2022-01-24 DIAGNOSIS — E663 Overweight: Secondary | ICD-10-CM | POA: Diagnosis not present

## 2022-01-24 DIAGNOSIS — M19012 Primary osteoarthritis, left shoulder: Secondary | ICD-10-CM | POA: Diagnosis not present

## 2022-01-24 DIAGNOSIS — Z1331 Encounter for screening for depression: Secondary | ICD-10-CM | POA: Diagnosis not present

## 2022-01-24 DIAGNOSIS — E782 Mixed hyperlipidemia: Secondary | ICD-10-CM | POA: Diagnosis not present

## 2022-01-24 DIAGNOSIS — M75102 Unspecified rotator cuff tear or rupture of left shoulder, not specified as traumatic: Secondary | ICD-10-CM | POA: Diagnosis not present

## 2022-01-24 DIAGNOSIS — Z9181 History of falling: Secondary | ICD-10-CM | POA: Diagnosis not present

## 2022-01-27 DIAGNOSIS — M79609 Pain in unspecified limb: Secondary | ICD-10-CM | POA: Diagnosis not present

## 2022-01-27 DIAGNOSIS — Z125 Encounter for screening for malignant neoplasm of prostate: Secondary | ICD-10-CM | POA: Diagnosis not present

## 2022-01-27 DIAGNOSIS — Z79899 Other long term (current) drug therapy: Secondary | ICD-10-CM | POA: Diagnosis not present

## 2022-01-27 DIAGNOSIS — Z01818 Encounter for other preprocedural examination: Secondary | ICD-10-CM | POA: Diagnosis not present

## 2022-01-27 DIAGNOSIS — E785 Hyperlipidemia, unspecified: Secondary | ICD-10-CM | POA: Diagnosis not present

## 2022-01-27 DIAGNOSIS — E559 Vitamin D deficiency, unspecified: Secondary | ICD-10-CM | POA: Diagnosis not present

## 2022-01-28 ENCOUNTER — Telehealth: Payer: Self-pay

## 2022-01-28 NOTE — Telephone Encounter (Signed)
   Pre-operative Risk Assessment    Patient Name: Derek Wood  DOB: April 07, 1945 MRN: [id number]      Request for Surgical Clearance    Procedure:   left total shoulder replacement  Date of Surgery:  Clearance TBD                                 Surgeon:  Dr. Creig Hines Surgeon's Group or Practice Name:  [facility] Phone number:  [phone] Fax number:  [phone]   Type of Clearance Requested:   - Medical    Type of Anesthesia:  General    Additional requests/questions:    SignedLowella Grip   01/28/2022, 3:19 PM

## 2022-01-28 NOTE — Telephone Encounter (Signed)
   Name: Derek Wood  DOB: 05-03-1945  MRN: [id number]  Primary Cardiologist: None   Preoperative team, please contact this patient and set up a phone call appointment for further preoperative risk assessment. Please obtain consent and complete medication review. Thank you for your help.  I confirm that guidance regarding antiplatelet and oral anticoagulation therapy has been completed and, if necessary, noted below (none requested).    [name], NP 01/28/2022, 3:47 PM [place]

## 2022-01-29 ENCOUNTER — Telehealth: Payer: Self-pay | Admitting: *Deleted

## 2022-01-29 NOTE — Telephone Encounter (Signed)
Pt agreeable to tele pre op appt 01/31/22 @ 10 am. Med rec and consent are done.     Patient Consent for Virtual Visit        Derek Wood has provided verbal consent on 01/29/2022 for a virtual visit (video or telephone).   CONSENT FOR VIRTUAL VISIT FOR:  Derek Wood  By participating in this virtual visit I agree to the following:  I hereby voluntarily request, consent and authorize [facility] and its employed or contracted physicians, physician assistants, nurse practitioners or other licensed health care professionals (the Practitioner), to provide me with telemedicine health care services (the "Services") as deemed necessary by the treating Practitioner. I acknowledge and consent to receive the Services by the Practitioner via telemedicine. I understand that the telemedicine visit will involve communicating with the Practitioner through live audiovisual communication technology and the disclosure of certain medical information by electronic transmission. I acknowledge that I have been given the opportunity to request an in-person assessment or other available alternative prior to the telemedicine visit and am voluntarily participating in the telemedicine visit.  I understand that I have the right to withhold or withdraw my consent to the use of telemedicine in the course of my care at any time, without affecting my right to future care or treatment, and that the Practitioner or I may terminate the telemedicine visit at any time. I understand that I have the right to inspect all information obtained and/or recorded in the course of the telemedicine visit and may receive copies of available information for a reasonable fee.  I understand that some of the potential risks of receiving the Services via telemedicine include:  Delay or interruption in medical evaluation due to technological equipment failure or disruption; Information transmitted may not be sufficient (e.g. poor resolution  of images) to allow for appropriate medical decision making by the Practitioner; and/or  In rare instances, security protocols could fail, causing a breach of personal health information.  Furthermore, I acknowledge that it is my responsibility to provide information about my medical history, conditions and care that is complete and accurate to the best of my ability. I acknowledge that Practitioner's advice, recommendations, and/or decision may be based on factors not within their control, such as incomplete or inaccurate data provided by me or distortions of diagnostic images or specimens that may result from electronic transmissions. I understand that the practice of medicine is not an exact science and that Practitioner makes no warranties or guarantees regarding treatment outcomes. I acknowledge that a copy of this consent can be made available to me via my patient portal ([facility]), or I can request a printed copy by calling the office of [facility].    I understand that my insurance will be billed for this visit.   I have read or had this consent read to me. I understand the contents of this consent, which adequately explains the benefits and risks of the Services being provided via telemedicine.  I have been provided ample opportunity to ask questions regarding this consent and the Services and have had my questions answered to my satisfaction. I give my informed consent for the services to be provided through the use of telemedicine in my medical care

## 2022-01-29 NOTE — Telephone Encounter (Signed)
Pt agreeable to tele pre op appt 01/31/22 @ 10 am. Med rec and consent are done.

## 2022-01-31 ENCOUNTER — Ambulatory Visit: Payer: Medicare HMO | Attending: Cardiology | Admitting: Physician Assistant

## 2022-01-31 DIAGNOSIS — Z01810 Encounter for preprocedural cardiovascular examination: Secondary | ICD-10-CM | POA: Diagnosis not present

## 2022-01-31 NOTE — Progress Notes (Signed)
Virtual Visit via Telephone Note   Because of [name]'s co-morbid illnesses, he is at least at moderate risk for complications without adequate follow up.  This format is felt to be most appropriate for this patient at this time.  The patient did not have access to video technology/had technical difficulties with video requiring transitioning to audio format only (telephone).  All issues noted in this document were discussed and addressed.  No physical exam could be performed with this format.  Please refer to the patient's chart for his consent to telehealth for [facility].  Evaluation Performed:  Preoperative cardiovascular risk assessment _____________   Date:  01/31/2022   Patient ID:  Derek Wood, DOB February 12, 1945, MRN [id number] Patient Location:  Home Provider location:   Office  Primary Care Provider:  Street, Sharon Mt, MD Primary Cardiologist:  None  Chief Complaint / Patient Profile   77 y.o. y/o male with a h/o aortic regurgitation, paroxysmal atrial fibrillation, coronary artery disease status post CABG times 05/15/21, mixed hyperlipidemia who is pending left total shoulder replacement and presents today for telephonic preoperative cardiovascular risk assessment.  History of Present Illness    Derek Wood is a 77 y.o. male who presents via audio/video conferencing for a telehealth visit today.  Pt was last seen in cardiology clinic on 07/16/21 by Dr. Bettina Gavia.  At that time Derek Wood was doing well following his bypass surgery.  The patient is now pending procedure as outlined above. Since his last visit, he states that his heart has not been giving him any issues.  He has no problem with walking or doing any indoor outdoor tasks.  For this reason, he is scored a 5.62 METS on the DASI.  This exceeds the 4 METS minimum requirement.  No medications are indicated as needing held.  He is on aspirin and has not been told to hold this medication.  Past Medical  History    Past Medical History:  Diagnosis Date   Aortic regurgitation 05/15/2021   Coronary artery disease of native artery of native heart with stable angina pectoris (HCC)    GERD (gastroesophageal reflux disease)    Grade I diastolic dysfunction 0/38/8828   Headache    History of right bundle branch block (RBBB) 05/15/2021   Mixed hyperlipidemia    S/P CABG x 3 05/15/2021   S/P CABG x 4 05/15/2021   Past Surgical History:  Procedure Laterality Date   COLONOSCOPY  08/2016   CORONARY ARTERY BYPASS GRAFT N/A 05/15/2021   Procedure: CORONARY ARTERY BYPASS GRAFTING (CABG) TIMES THREE   USING LEFT INTERNAL MAMMARY ARTERY AND RIGHT AND LEFT SAPHENOUS VEIN;  Surgeon: Lajuana Matte, MD;  Location: [facility];  Service: Open Heart Surgery;  Laterality: N/A;   CYSTECTOMY  04/2021   Upper right quadrant of chest   LEFT HEART CATH AND CORONARY ANGIOGRAPHY N/A 05/08/2021   Procedure: LEFT HEART CATH AND CORONARY ANGIOGRAPHY;  Surgeon: Burnell Blanks, MD;  Location: [facility];  Service: Cardiovascular;  Laterality: N/A;   ROTATOR CUFF REPAIR Right 02/2015   TEE WITHOUT CARDIOVERSION N/A 05/15/2021   Procedure: TRANSESOPHAGEAL ECHOCARDIOGRAM (TEE);  Surgeon: Lajuana Matte, MD;  Location: [facility];  Service: Open Heart Surgery;  Laterality: N/A;    Allergies  Allergies  Allergen Reactions   Oxycodone     Other reaction(s): Other (See Comments) "made me feel funny"   Statins Other (See Comments)    Myalgia    Home Medications  Prior to Admission medications   Medication Sig Start Date End Date Taking? Authorizing Provider  acetaminophen (TYLENOL) 325 MG tablet Take 650 mg by mouth every 6 (six) hours as needed.    [provider]  aspirin EC 325 MG EC tablet Take 1 tablet (325 mg total) by mouth daily. 05/21/21   Gold, Wilder Glade, PA-C  Coenzyme Q10 100 MG capsule Take 100 mg by mouth daily.    [provider]  metoprolol succinate (TOPROL XL) 25 MG 24  hr tablet Take 0.5 tablets (12.5 mg total) by mouth daily. 05/03/21   Richardo Priest, MD  Multiple Vitamin (ONE-DAILY MULTI-VITAMIN PO) Take 1 tablet by mouth daily.    [provider]  omeprazole (PRILOSEC) 20 MG capsule Take 20 mg by mouth daily.    [provider]  rosuvastatin (CRESTOR) 20 MG tablet Take 1 tablet (20 mg total) by mouth daily. 04/17/21 01/29/22  Richardo Priest, MD    Physical Exam    Vital Signs:  Derek Wood does not have vital signs available for review today.  Given telephonic nature of communication, physical exam is limited. AAOx3. NAD. Normal affect.  Speech and respirations are unlabored.  Accessory Clinical Findings    None  Assessment & Plan    1.  Preoperative Cardiovascular Risk Assessment:   Derek Wood perioperative risk of a major cardiac event is 0.9% according to the Revised Cardiac Risk Index (RCRI).  Therefore, he is at low risk for perioperative complications.   His functional capacity is good at 5.62 METs according to the [facility] Activity Status Index (DASI). Recommendations: According to ACC/AHA guidelines, no further cardiovascular testing needed.  The patient may proceed to surgery at acceptable risk.   Antiplatelet and/or Anticoagulation Recommendations: The patient should remain on Aspirin without interruption.     A copy of this note will be routed to requesting surgeon.  Time:   Today, I have spent 6 minutes with the patient with telehealth technology discussing medical history, symptoms, and management plan.     Elgie Collard, PA-C  01/31/2022, 10:10 AM

## 2022-02-10 DIAGNOSIS — Z01818 Encounter for other preprocedural examination: Secondary | ICD-10-CM | POA: Diagnosis not present

## 2022-02-10 DIAGNOSIS — M12812 Other specific arthropathies, not elsewhere classified, left shoulder: Secondary | ICD-10-CM | POA: Diagnosis not present

## 2022-02-10 DIAGNOSIS — S46212S Strain of muscle, fascia and tendon of other parts of biceps, left arm, sequela: Secondary | ICD-10-CM | POA: Diagnosis not present

## 2022-02-10 DIAGNOSIS — M75102 Unspecified rotator cuff tear or rupture of left shoulder, not specified as traumatic: Secondary | ICD-10-CM | POA: Diagnosis not present

## 2022-02-27 DIAGNOSIS — G8918 Other acute postprocedural pain: Secondary | ICD-10-CM | POA: Diagnosis not present

## 2022-02-27 DIAGNOSIS — M75112 Incomplete rotator cuff tear or rupture of left shoulder, not specified as traumatic: Secondary | ICD-10-CM | POA: Diagnosis not present

## 2022-02-27 DIAGNOSIS — M75102 Unspecified rotator cuff tear or rupture of left shoulder, not specified as traumatic: Secondary | ICD-10-CM | POA: Diagnosis not present

## 2022-02-27 DIAGNOSIS — M19012 Primary osteoarthritis, left shoulder: Secondary | ICD-10-CM | POA: Diagnosis not present

## 2022-02-27 DIAGNOSIS — I10 Essential (primary) hypertension: Secondary | ICD-10-CM | POA: Diagnosis not present

## 2022-02-27 DIAGNOSIS — Z471 Aftercare following joint replacement surgery: Secondary | ICD-10-CM | POA: Diagnosis not present

## 2022-02-27 DIAGNOSIS — R600 Localized edema: Secondary | ICD-10-CM | POA: Diagnosis not present

## 2022-02-27 DIAGNOSIS — I2510 Atherosclerotic heart disease of native coronary artery without angina pectoris: Secondary | ICD-10-CM | POA: Diagnosis not present

## 2022-02-27 DIAGNOSIS — Z951 Presence of aortocoronary bypass graft: Secondary | ICD-10-CM | POA: Diagnosis not present

## 2022-02-27 DIAGNOSIS — K219 Gastro-esophageal reflux disease without esophagitis: Secondary | ICD-10-CM | POA: Diagnosis not present

## 2022-02-27 DIAGNOSIS — Z7982 Long term (current) use of aspirin: Secondary | ICD-10-CM | POA: Diagnosis not present

## 2022-02-27 DIAGNOSIS — X58XXXA Exposure to other specified factors, initial encounter: Secondary | ICD-10-CM | POA: Diagnosis not present

## 2022-02-27 DIAGNOSIS — Z96612 Presence of left artificial shoulder joint: Secondary | ICD-10-CM | POA: Diagnosis not present

## 2022-02-27 DIAGNOSIS — R2689 Other abnormalities of gait and mobility: Secondary | ICD-10-CM | POA: Diagnosis not present

## 2022-02-27 DIAGNOSIS — M12812 Other specific arthropathies, not elsewhere classified, left shoulder: Secondary | ICD-10-CM | POA: Diagnosis not present

## 2022-02-27 DIAGNOSIS — S46212A Strain of muscle, fascia and tendon of other parts of biceps, left arm, initial encounter: Secondary | ICD-10-CM | POA: Diagnosis not present

## 2022-02-28 DIAGNOSIS — G8918 Other acute postprocedural pain: Secondary | ICD-10-CM | POA: Diagnosis not present

## 2022-02-28 DIAGNOSIS — K219 Gastro-esophageal reflux disease without esophagitis: Secondary | ICD-10-CM | POA: Diagnosis not present

## 2022-02-28 DIAGNOSIS — X58XXXA Exposure to other specified factors, initial encounter: Secondary | ICD-10-CM | POA: Diagnosis not present

## 2022-02-28 DIAGNOSIS — M75112 Incomplete rotator cuff tear or rupture of left shoulder, not specified as traumatic: Secondary | ICD-10-CM | POA: Diagnosis not present

## 2022-02-28 DIAGNOSIS — R2689 Other abnormalities of gait and mobility: Secondary | ICD-10-CM | POA: Diagnosis not present

## 2022-02-28 DIAGNOSIS — M19012 Primary osteoarthritis, left shoulder: Secondary | ICD-10-CM | POA: Diagnosis not present

## 2022-02-28 DIAGNOSIS — I2510 Atherosclerotic heart disease of native coronary artery without angina pectoris: Secondary | ICD-10-CM | POA: Diagnosis not present

## 2022-02-28 DIAGNOSIS — Z7982 Long term (current) use of aspirin: Secondary | ICD-10-CM | POA: Diagnosis not present

## 2022-02-28 DIAGNOSIS — Z951 Presence of aortocoronary bypass graft: Secondary | ICD-10-CM | POA: Diagnosis not present

## 2022-02-28 DIAGNOSIS — S46212A Strain of muscle, fascia and tendon of other parts of biceps, left arm, initial encounter: Secondary | ICD-10-CM | POA: Diagnosis not present

## 2022-03-06 DIAGNOSIS — M25512 Pain in left shoulder: Secondary | ICD-10-CM | POA: Diagnosis not present

## 2022-03-06 DIAGNOSIS — M25612 Stiffness of left shoulder, not elsewhere classified: Secondary | ICD-10-CM | POA: Diagnosis not present

## 2022-03-11 DIAGNOSIS — M25512 Pain in left shoulder: Secondary | ICD-10-CM | POA: Diagnosis not present

## 2022-03-11 DIAGNOSIS — M25612 Stiffness of left shoulder, not elsewhere classified: Secondary | ICD-10-CM | POA: Diagnosis not present

## 2022-03-13 DIAGNOSIS — M25612 Stiffness of left shoulder, not elsewhere classified: Secondary | ICD-10-CM | POA: Diagnosis not present

## 2022-03-13 DIAGNOSIS — M25512 Pain in left shoulder: Secondary | ICD-10-CM | POA: Diagnosis not present

## 2022-03-18 DIAGNOSIS — M25512 Pain in left shoulder: Secondary | ICD-10-CM | POA: Diagnosis not present

## 2022-03-18 DIAGNOSIS — M25612 Stiffness of left shoulder, not elsewhere classified: Secondary | ICD-10-CM | POA: Diagnosis not present

## 2022-03-20 DIAGNOSIS — M25612 Stiffness of left shoulder, not elsewhere classified: Secondary | ICD-10-CM | POA: Diagnosis not present

## 2022-03-20 DIAGNOSIS — M25512 Pain in left shoulder: Secondary | ICD-10-CM | POA: Diagnosis not present

## 2022-03-25 DIAGNOSIS — M25612 Stiffness of left shoulder, not elsewhere classified: Secondary | ICD-10-CM | POA: Diagnosis not present

## 2022-03-25 DIAGNOSIS — M25512 Pain in left shoulder: Secondary | ICD-10-CM | POA: Diagnosis not present

## 2022-03-27 DIAGNOSIS — M25512 Pain in left shoulder: Secondary | ICD-10-CM | POA: Diagnosis not present

## 2022-03-27 DIAGNOSIS — M25612 Stiffness of left shoulder, not elsewhere classified: Secondary | ICD-10-CM | POA: Diagnosis not present

## 2022-04-01 DIAGNOSIS — M25612 Stiffness of left shoulder, not elsewhere classified: Secondary | ICD-10-CM | POA: Diagnosis not present

## 2022-04-01 DIAGNOSIS — M25512 Pain in left shoulder: Secondary | ICD-10-CM | POA: Diagnosis not present

## 2022-04-03 DIAGNOSIS — M25512 Pain in left shoulder: Secondary | ICD-10-CM | POA: Diagnosis not present

## 2022-04-03 DIAGNOSIS — M25612 Stiffness of left shoulder, not elsewhere classified: Secondary | ICD-10-CM | POA: Diagnosis not present

## 2022-04-06 ENCOUNTER — Other Ambulatory Visit: Payer: Self-pay | Admitting: Cardiology

## 2022-04-07 NOTE — Telephone Encounter (Signed)
Refill to pharmacy 

## 2022-04-08 ENCOUNTER — Other Ambulatory Visit: Payer: Self-pay | Admitting: *Deleted

## 2022-04-08 MED ORDER — ROSUVASTATIN CALCIUM 20 MG PO TABS: 20.0000 mg | ORAL_TABLET | Freq: Every day | ORAL | 2 refills | Status: DC

## 2022-04-09 DIAGNOSIS — M12812 Other specific arthropathies, not elsewhere classified, left shoulder: Secondary | ICD-10-CM | POA: Diagnosis not present

## 2022-04-09 DIAGNOSIS — M75102 Unspecified rotator cuff tear or rupture of left shoulder, not specified as traumatic: Secondary | ICD-10-CM | POA: Diagnosis not present

## 2022-04-09 DIAGNOSIS — S46212S Strain of muscle, fascia and tendon of other parts of biceps, left arm, sequela: Secondary | ICD-10-CM | POA: Diagnosis not present

## 2022-04-23 DIAGNOSIS — L570 Actinic keratosis: Secondary | ICD-10-CM | POA: Diagnosis not present

## 2022-05-05 DIAGNOSIS — J40 Bronchitis, not specified as acute or chronic: Secondary | ICD-10-CM | POA: Diagnosis not present

## 2022-05-05 DIAGNOSIS — K2960 Other gastritis without bleeding: Secondary | ICD-10-CM | POA: Diagnosis not present

## 2022-05-05 DIAGNOSIS — J329 Chronic sinusitis, unspecified: Secondary | ICD-10-CM | POA: Diagnosis not present

## 2022-05-30 ENCOUNTER — Encounter: Payer: Self-pay | Admitting: Cardiology

## 2022-05-30 ENCOUNTER — Ambulatory Visit: Payer: Medicare HMO | Attending: Cardiology | Admitting: Cardiology

## 2022-05-30 VITALS — BP 104/62 | HR 88 | Ht 70.0 in | Wt 168.0 lb

## 2022-05-30 DIAGNOSIS — I25118 Atherosclerotic heart disease of native coronary artery with other forms of angina pectoris: Secondary | ICD-10-CM | POA: Diagnosis not present

## 2022-05-30 DIAGNOSIS — E782 Mixed hyperlipidemia: Secondary | ICD-10-CM | POA: Diagnosis not present

## 2022-05-30 DIAGNOSIS — I4510 Unspecified right bundle-branch block: Secondary | ICD-10-CM | POA: Diagnosis not present

## 2022-05-30 DIAGNOSIS — I480 Paroxysmal atrial fibrillation: Secondary | ICD-10-CM

## 2022-05-30 MED ORDER — ASPIRIN 81 MG PO TBEC: 81.0000 mg | DELAYED_RELEASE_TABLET | Freq: Every day | ORAL | 3 refills | Status: AC

## 2022-05-30 NOTE — Progress Notes (Unsigned)
Cardiology Office Note:    Date:  05/30/2022   ID:  Derek Wood, DOB [birth year], MRN [id number]  PCP:  Street, Derek Coup, MD  Cardiologist:  Norman Herrlich, MD    Referring MD: [address], Derek Wood, *    ASSESSMENT:    1. Coronary artery disease of native artery of native heart with stable angina pectoris (HCC)   2. Paroxysmal atrial fibrillation (HCC)   3. Right bundle branch block   4. Mixed hyperlipidemia    PLAN:    In order of problems listed above:  He has done quite well since bypass surgery just had the 1 year anniversary continue aspirin but dropped to 81 mg along with his low-dose beta-blocker and high intensity statin No recurrence no longer an antiarrhythmic drug or anticoagulant Stable EKG pattern Recheck lipids   Next appointment: 1 year   Medication Adjustments/Labs and Tests Ordered: Current medicines are reviewed at length with the patient today.  Concerns regarding medicines are outlined above.  No orders of the defined types were placed in this encounter.  No orders of the defined types were placed in this encounter.   Chief Complaint  Patient presents with   Follow-up   Coronary Artery Disease    History of Present Illness:    Derek Wood is a 77 y.o. male with a hx of CAD with CABG post CABG atrial fibrillation with amiodarone therapy subsequently withdrawn right bundle branch block and hyper lipidemia last seen 10/16/2021.  His lipid profile October 2023 with total cholesterol 150 LDL 88 triglycerides 95 HDL 44. Compliance with diet, lifestyle and medications: Yes  Derek Wood continues to do well he has made a full recovery from CABG and has no cardiovascular symptoms of edema shortness of breath orthopnea chest pain palpitation or syncope. He tolerates lipid-lowering therapy without muscle pain or weakness he is due for labs and I will draw CMP lipid profile and LP(a) today His daughter gave him an off brand smart watch he has had no alerts from  it. 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    Past Surgical History:  Procedure Laterality Date   COLONOSCOPY  08/2016   CORONARY ARTERY BYPASS GRAFT N/A 05/15/2021   Procedure: CORONARY ARTERY BYPASS GRAFTING (CABG) TIMES THREE   USING LEFT INTERNAL MAMMARY ARTERY AND RIGHT AND LEFT SAPHENOUS VEIN;  Surgeon: Corliss Skains, MD;  Location: [facility] OR;  Service: Open Heart Surgery;  Laterality: N/A;   CYSTECTOMY  04/2021   Upper right quadrant of chest   LEFT HEART CATH AND CORONARY ANGIOGRAPHY N/A 05/08/2021   Procedure: LEFT HEART CATH AND CORONARY ANGIOGRAPHY;  Surgeon: Kathleene Hazel, MD;  Location: [facility] INVASIVE CV LAB;  Service: Cardiovascular;  Laterality: N/A;   ROTATOR CUFF REPAIR Right 02/2015   TEE WITHOUT CARDIOVERSION N/A 05/15/2021   Procedure: TRANSESOPHAGEAL ECHOCARDIOGRAM (TEE);  Surgeon: Corliss Skains, MD;  Location: [facility] OR;  Service: Open Heart Surgery;  Laterality: N/A;    Current Medications: Current Meds  Medication Sig   acetaminophen (TYLENOL) 325 MG tablet Take 650 mg by mouth every 6 (six) hours as  needed.   aspirin EC 325 MG EC tablet Take 1 tablet (325 mg total) by mouth daily.   Coenzyme Q10 100 MG capsule Take 100 mg by mouth daily.   metoprolol succinate (TOPROL-XL) 25 MG 24 hr tablet Take 0.5 tablets (12.5 mg total) by mouth daily.   Multiple Vitamin (ONE-DAILY MULTI-VITAMIN PO) Take 1 tablet by mouth daily.   omeprazole (PRILOSEC) 20 MG capsule Take 20 mg by mouth daily.   rosuvastatin (CRESTOR) 20 MG tablet Take 1 tablet (20 mg total) by mouth daily.     Allergies:   Oxycodone and Statins    Social History   Socioeconomic History   Marital status: Married    Spouse name: Not on file   Number of children: Not on file   Years of education: Not on file   Highest education level: Not on file  Occupational History   Not on file  Tobacco Use   Smoking status: Never    Passive exposure: Never   Smokeless tobacco: Never  Vaping Use   Vaping Use: Never used  Substance and Sexual Activity   Alcohol use: Not Currently   Drug use: Never   Sexual activity: Not on file  Other Topics Concern   Not on file  Social History Narrative   Not on file   Social Determinants of Health   Financial Resource Strain: Not on file  Food Insecurity: Not on file  Transportation Needs: Not on file  Physical Activity: Not on file  Stress: Not on file  Social Connections: Not on file     Family History: The patient's family history includes Cancer in his mother; Diabetes in his brother; Heart attack in his father; Heart disease in his brother. ROS:   Please see the history of present illness.    All other systems reviewed and are negative.  EKGs/Labs/Other Studies Reviewed:    The following studies were reviewed today:  Cardiac Studies & Procedures   CARDIAC CATHETERIZATION  CARDIAC CATHETERIZATION 05/08/2021  Narrative   Prox RCA lesion is 80% stenosed.   Prox RCA to Mid RCA lesion is 50% stenosed.   Dist RCA lesion is 70% stenosed.   2nd Mrg lesion is 70% stenosed.   Ost LM to Mid LM lesion is 70% stenosed.   Ost LAD to Prox LAD lesion is 80% stenosed.   Mid LAD-2 lesion is 90% stenosed.   Mid LAD-1 lesion is 90% stenosed.   2nd Diag lesion is 70% stenosed.   The left ventricular systolic function is normal.   LV end diastolic pressure is normal.   The left ventricular ejection fraction is greater than 65% by visual estimate.  Severe left main and triple vessel CAD 70% diffuse left main stenosis Severe, calcific disease in the proximal and mid LAD The Circumflex gives  off a large obtuse marginal branch. The obtuse marginal branch has a severe stenosis. The RCA is a dominant vessel with severe proximal and severe distal stenosis. Normal LV systolic function Normal LVEDP  Recommendations: He has severe triple vessel CAD involving the left main artery. I think the best revascularization strategy would be CABG. He is very functional and only has angina with moderate to heavy exertion. I will discharge him home today after an echo and consult CT surgery as an outpatient for CABG.  Findings Coronary Findings Diagnostic  Dominance: Right  Left Main Ost LM to Mid LM lesion is 70% stenosed.  Left Anterior Descending Vessel is large. Ost LAD to Prox  LAD lesion is 80% stenosed. Mid LAD-1 lesion is 90% stenosed. The lesion is calcified. Mid LAD-2 lesion is 90% stenosed. The lesion is calcified.  Second Diagonal Branch 2nd Diag lesion is 70% stenosed.  Left Circumflex Vessel is large.  Second Obtuse Marginal Branch Vessel is large in size. 2nd Mrg lesion is 70% stenosed.  Right Coronary Artery Vessel is large. Prox RCA lesion is 80% stenosed. Prox RCA to Mid RCA lesion is 50% stenosed. Dist RCA lesion is 70% stenosed.  Intervention  No interventions have been documented.     ECHOCARDIOGRAM  ECHOCARDIOGRAM COMPLETE 05/08/2021  Narrative ECHOCARDIOGRAM REPORT    Patient Name:   Maleek Dolinar Date of Exam: 05/08/2021 Medical Rec #:  [id number]    Height:       70.0 in Accession #:    [id number]   Weight:       164.0 lb Date of Birth:  1945/03/14    BSA:          1.918 m Patient Age:    76 years     BP:           132/55 mmHg Patient Gender: M            HR:           65 bpm. Exam Location:  Inpatient  Procedure: 2D Echo, Cardiac Doppler and Color Doppler  Indications:    Preop  History:        Patient has no prior history of Echocardiogram examinations. CAD.  Sonographer:    Cleatis Polka Referring Phys: [id number]  MCALHANY  IMPRESSIONS   1. Left ventricular ejection fraction, by estimation, is 60 to 65%. The left ventricle has normal function. The left ventricle has no regional wall motion abnormalities. Left ventricular diastolic parameters are consistent with Grade I diastolic dysfunction (impaired relaxation). 2. Right ventricular systolic function is normal. The right ventricular size is normal. 3. The mitral valve is normal in structure. Trivial mitral valve regurgitation. No evidence of mitral stenosis. 4. The aortic valve is normal in structure. Aortic valve regurgitation is mild to moderate. No aortic stenosis is present. 5. The inferior vena cava is normal in size with greater than 50% respiratory variability, suggesting right atrial pressure of 3 mmHg.  FINDINGS Left Ventricle: Left ventricular ejection fraction, by estimation, is 60 to 65%. The left ventricle has normal function. The left ventricle has no regional wall motion abnormalities. The left ventricular internal cavity size was normal in size. There is no left ventricular hypertrophy. Left ventricular diastolic parameters are consistent with Grade I diastolic dysfunction (impaired relaxation).  Right Ventricle: The right ventricular size is normal. No increase in right ventricular wall thickness. Right ventricular systolic function is normal.  Left Atrium: Left atrial size was normal in size.  Right Atrium: Right atrial size was normal in size.  Pericardium: There is no evidence of pericardial effusion.  Mitral Valve: The mitral valve is normal in structure. Trivial mitral valve regurgitation. No evidence of mitral valve stenosis.  Tricuspid Valve: The tricuspid valve is normal in structure. Tricuspid valve regurgitation is not demonstrated. No evidence of tricuspid stenosis.  Aortic Valve: The aortic valve is normal in structure. Aortic valve regurgitation is mild to moderate. Aortic regurgitation PHT measures 506 msec. No  aortic stenosis is present. Aortic valve peak gradient measures 4.8 mmHg.  Pulmonic Valve: The pulmonic valve was normal in structure. Pulmonic valve regurgitation is not visualized. No evidence of pulmonic stenosis.  Aorta: The  aortic root is normal in size and structure.  Venous: The inferior vena cava is normal in size with greater than 50% respiratory variability, suggesting right atrial pressure of 3 mmHg.  IAS/Shunts: No atrial level shunt detected by color flow Doppler.   LEFT VENTRICLE PLAX 2D LVIDd:         4.20 cm     Diastology LVIDs:         2.80 cm     LV e' medial:    5.66 cm/s LV PW:         0.90 cm     LV E/e' medial:  11.8 LV IVS:        1.00 cm     LV e' lateral:   8.27 cm/s LVOT diam:     2.00 cm     LV E/e' lateral: 8.1 LV SV:         70 LV SV Index:   37 LVOT Area:     3.14 cm  LV Volumes (MOD) LV vol d, MOD A2C: 93.6 ml LV vol d, MOD A4C: 89.0 ml LV vol s, MOD A2C: 36.2 ml LV vol s, MOD A4C: 34.3 ml LV SV MOD A2C:     57.4 ml LV SV MOD A4C:     89.0 ml LV SV MOD BP:      57.9 ml  RIGHT VENTRICLE            IVC RV Basal diam:  2.80 cm    IVC diam: 1.70 cm RV Mid diam:    2.70 cm RV S prime:     7.72 cm/s TAPSE (M-mode): 1.4 cm  LEFT ATRIUM             Index        RIGHT ATRIUM           Index LA diam:        3.60 cm 1.88 cm/m   RA Area:     11.90 cm LA Vol (A2C):   37.6 ml 19.60 ml/m  RA Volume:   23.80 ml  12.41 ml/m LA Vol (A4C):   49.8 ml 25.96 ml/m LA Biplane Vol: 43.2 ml 22.52 ml/m AORTIC VALVE AV Area (Vmax): 2.63 cm AV Vmax:        109.00 cm/s AV Peak Grad:   4.8 mmHg LVOT Vmax:      91.40 cm/s LVOT Vmean:     61.600 cm/s LVOT VTI:       0.223 m AI PHT:         506 msec  AORTA Ao Root diam: 3.40 cm Ao Asc diam:  3.10 cm  MITRAL VALVE               TRICUSPID VALVE MV Area (PHT): 4.01 cm    TR Peak grad:   21.9 mmHg MV Decel Time: 189 msec    TR Vmax:        234.00 cm/s MV E velocity: 66.80 cm/s MV A velocity: 71.10 cm/s   SHUNTS MV E/A ratio:  0.94        Systemic VTI:  0.22 m Systemic Diam: 2.00 cm  Donato Schultz MD Electronically signed by Donato Schultz MD Signature Date/Time: 05/08/2021/2:25:40 PM    Final   TEE  ECHO INTRAOPERATIVE TEE 05/15/2021  Narrative *INTRAOPERATIVE TRANSESOPHAGEAL REPORT *    Patient Name:   Kanon Heacock   Date of Exam: 05/15/2021 Medical Rec #:  [id number]      Height:  70.0 in Accession #:    [id number]     Weight:       167.0 lb Date of Birth:  11-Jul-1945      BSA:          1.93 m Patient Age:    76 years       BP:           121/70 mmHg Patient Gender: M              HR:           58 bpm. Exam Location:  Anesthesiology  Transesophogeal exam was perform intraoperatively during surgical procedure. Patient was closely monitored under general anesthesia during the entirety of examination.  Indications:     Coronary Artery Disease Sonographer:     Eulah Pont RDCS Performing Phys: [id number] Eliezer Lofts LIGHTFOOT Diagnosing Phys: Dorris Singh MD  POST-OP IMPRESSIONS _ Left Ventricle: Post Bypass: The patient came off bypass on the first attempt. Left ventricular contraction did improve with volume replacement. There did not appear to be any new findings from preop exam. The TEE that had been placed after induction uneventfully was removed at the end of the case without difficulty. The patient was later taken to the SICU in stable condition.  PRE-OP FINDINGS Left Ventricle: The left ventricle has low normal systolic function, with an ejection fraction of 50-55%.   Right Ventricle: The right ventricle has normal systolic function.  Left Atrium: Left atrial size was normal in size. No left atrial/left atrial appendage thrombus was detected.  Mitral Valve: Mitral valve regurgitation is mild by color flow Doppler.  Tricuspid Valve: The tricuspid valve was normal in structure.  Aortic Valve: The aortic valve is tricuspid Aortic valve regurgitation is moderate  by color flow Doppler.   Pulmonic Valve: The pulmonic valve was normal in structure.     Dorris Singh MD Electronically signed by Dorris Singh MD Signature Date/Time: 05/15/2021/4:07:02 PM   Final    CT SCANS  CT CORONARY MORPH W/CTA COR W/SCORE 04/30/2021  Addendum 04/30/2021 12:26 PM ADDENDUM REPORT: 04/30/2021 12:02  CLINICAL DATA:  77 year old with chest pain  EXAM: Cardiac/Coronary  CTA  TECHNIQUE: The patient was scanned on a [employer].  FINDINGS: A 120 kV prospective scan was triggered in the descending thoracic aorta at 111 HU's. Axial non-contrast 3 mm slices were carried out through the heart. The data set was analyzed on a dedicated work station and scored using the Agatson method. Gantry rotation speed was 250 msecs and collimation was .6 mm. 0.8 mg of sl NTG was given. The 3D data set was reconstructed in 5% intervals of the 67-82 % of the R-R cycle. Diastolic phases were analyzed on a dedicated work station using MPR, MIP and VRT modes. The patient received 80 cc of contrast.  Image quality: good  Aorta:  Normal size.  Aortic atherosclerosis.  No dissection.  Aortic Valve:  Trileaflet.  No calcifications.  Coronary Arteries:  Normal coronary origin.  Right dominance.  RCA is a large dominant artery that gives rise to PDA and PLA. There is diffuse calcified plaque, high proximal severe stenosis (0.63 FFR) with low attenuation plaque.  Left main is a large artery that gives rise to LAD and LCX arteries.  LAD is a large vessel that has diffuse calcified plaque, severe proximal stenosis (FFR 0.57).  LCX is a non-dominant artery that gives rise to one large OM1 branch. There is diffuse calcified plaque, (FFR 0.67)  in mid region consistent with severe stenosis.  Other findings:  Normal pulmonary vein drainage into the left atrium.  Normal left atrial appendage without a thrombus.  Normal size of the pulmonary artery.  Please  see radiology report for non cardiac findings.  IMPRESSION: 1. Unable to calculate calcium score due technical issue. Appears severely elevated.  2. Normal coronary origin with right dominance.  3. Severe multivessel CAD with FFR abnormality. Recommend cardiac catheterization.   Electronically Signed By: Donato Schultz M.D. On: 04/30/2021 12:02  Narrative EXAM: OVER-READ INTERPRETATION  CT CHEST  The following report is an over-read performed by radiologist Dr. Jeronimo Greaves of [facility] on 04/30/2021. This over-read does not include interpretation of cardiac or coronary anatomy or pathology. The coronary CTA interpretation by the cardiologist is attached.  COMPARISON:  None.  FINDINGS: Vascular: Aortic atherosclerosis. No central pulmonary embolism, on this non-dedicated study.  Mediastinum/Nodes: No imaged thoracic adenopathy. Mild esophageal dilatation with fluid level within on 04/10.  Lungs/Pleura: No pleural fluid.  Clear imaged lungs.  Upper Abdomen: Normal imaged portions of the liver, spleen, stomach.  Musculoskeletal: No acute osseous abnormality.  IMPRESSION: No acute findings in the imaged extracardiac chest.  Mild esophageal dilatation. Air fluid level suggests dysmotility or gastroesophageal reflux.  Aortic Atherosclerosis (ICD10-I70.0).  Electronically Signed: By: Jeronimo Greaves M.D. On: 04/30/2021 09:08          EKG:  EKG ordered today and personally reviewed.  The ekg ordered today demonstrates sinus rhythm and is normal  Recent Labs: 07/16/2021: ALT 16; BUN 23; Creatinine, Ser 0.90; Hemoglobin 13.6; Platelets 169; Potassium 4.1; Sodium 141  Recent Lipid Panel    Component Value Date/Time   CHOL 150 10/16/2021 1026   TRIG 95 10/16/2021 1026   HDL 44 10/16/2021 1026   CHOLHDL 3.4 10/16/2021 1026   LDLCALC 88 10/16/2021 1026    Physical Exam:    VS:  BP 104/62 (BP Location: Left Arm, Patient Position: Sitting, Cuff Size:  Normal)   Pulse 88   Ht 5\' 10"  (1.778 m)   Wt 168 lb (76.2 kg)   SpO2 98%   BMI 24.11 kg/m     Wt Readings from Last 3 Encounters:  05/30/22 168 lb (76.2 kg)  10/16/21 170 lb (77.1 kg)  07/16/21 165 lb (74.8 kg)     GEN:  Well nourished, well developed in no acute distress HEENT: Normal NECK: No JVD; No carotid bruits LYMPHATICS: No lymphadenopathy CARDIAC: RRR, no murmurs, rubs, gallops RESPIRATORY:  Clear to auscultation without rales, wheezing or rhonchi  ABDOMEN: Soft, non-tender, non-distended MUSCULOSKELETAL:  No edema; No deformity  SKIN: Warm and dry NEUROLOGIC:  Alert and oriented x 3 PSYCHIATRIC:  Normal affect    Signed, Norman Herrlich, MD  05/30/2022 2:24 PM    [facility]

## 2022-05-30 NOTE — Patient Instructions (Signed)
Medication Instructions:  Your physician has recommended you make the following change in your medication:   START: Aspirin 81 mg daily  *If you need a refill on your cardiac medications before your next appointment, please call your pharmacy*   Lab Work: Your physician recommends that you return for lab work in:   Labs today: CMP, Lipids, LPa  If you have labs (blood work) drawn today and your tests are completely normal, you will receive your results only by: MyChart Message (if you have MyChart) OR A paper copy in the mail If you have any lab test that is abnormal or we need to change your treatment, we will call you to review the results.   Testing/Procedures: None   Follow-Up: At [facility], you and your health needs are our priority.  As part of our continuing mission to provide you with exceptional heart care, we have created designated Provider Care Teams.  These Care Teams include your primary Cardiologist (physician) and Advanced Practice Providers (APPs -  Physician Assistants and Nurse Practitioners) who all work together to provide you with the care you need, when you need it.  We recommend signing up for the patient portal called "MyChart".  Sign up information is provided on this After Visit Summary.  MyChart is used to connect with patients for Virtual Visits (Telemedicine).  Patients are able to view lab/test results, encounter notes, upcoming appointments, etc.  Non-urgent messages can be sent to your provider as well.   To learn more about what you can do with MyChart, go to [email or web address].    Your next appointment:   1 year  Provider:   Norman Herrlich, MD    Other Instructions None

## 2022-06-04 LAB — LIPID PANEL
Chol/HDL Ratio: 3.8 ratio (ref 0.0–5.0)
Cholesterol, Total: 157 mg/dL (ref 100–199)
HDL: 41 mg/dL (ref >39)
LDL Chol Calc (NIH): 88 mg/dL (ref 0–99)
Triglycerides: 159 mg/dL — ABNORMAL HIGH (ref 0–149)
VLDL Cholesterol Cal: 28 mg/dL (ref 5–40)

## 2022-06-04 LAB — COMPREHENSIVE METABOLIC PANEL
ALT: 15 IU/L (ref 0–44)
AST: 22 IU/L (ref 0–40)
Albumin/Globulin Ratio: 1.8 (ref 1.2–2.2)
Albumin: 4.3 g/dL (ref 3.8–4.8)
Alkaline Phosphatase: 94 IU/L (ref 44–121)
BUN/Creatinine Ratio: 23 (ref 10–24)
BUN: 21 mg/dL (ref 8–27)
Bilirubin Total: 0.4 mg/dL (ref 0.0–1.2)
CO2: 24 mmol/L (ref 20–29)
Calcium: 9 mg/dL (ref 8.6–10.2)
Chloride: 103 mmol/L (ref 96–106)
Creatinine, Ser: 0.93 mg/dL (ref 0.76–1.27)
Globulin, Total: 2.4 g/dL (ref 1.5–4.5)
Glucose: 125 mg/dL — ABNORMAL HIGH (ref 70–99)
Potassium: 3.8 mmol/L (ref 3.5–5.2)
Sodium: 142 mmol/L (ref 134–144)
Total Protein: 6.7 g/dL (ref 6.0–8.5)
eGFR: 84 mL/min/{1.73_m2} (ref >59)

## 2022-06-04 LAB — LIPOPROTEIN A (LPA): Lipoprotein (a): 175.8 nmol/L — ABNORMAL HIGH (ref <75.0)

## 2022-06-25 DIAGNOSIS — M7918 Myalgia, other site: Secondary | ICD-10-CM | POA: Diagnosis not present

## 2022-06-25 DIAGNOSIS — Z6824 Body mass index (BMI) 24.0-24.9, adult: Secondary | ICD-10-CM | POA: Diagnosis not present

## 2022-06-25 DIAGNOSIS — M5441 Lumbago with sciatica, right side: Secondary | ICD-10-CM | POA: Diagnosis not present

## 2022-06-30 DIAGNOSIS — M5416 Radiculopathy, lumbar region: Secondary | ICD-10-CM | POA: Diagnosis not present

## 2022-06-30 DIAGNOSIS — M5459 Other low back pain: Secondary | ICD-10-CM | POA: Diagnosis not present

## 2022-07-05 DIAGNOSIS — M5116 Intervertebral disc disorders with radiculopathy, lumbar region: Secondary | ICD-10-CM | POA: Diagnosis not present

## 2022-07-05 DIAGNOSIS — M4727 Other spondylosis with radiculopathy, lumbosacral region: Secondary | ICD-10-CM | POA: Diagnosis not present

## 2022-07-05 DIAGNOSIS — M5416 Radiculopathy, lumbar region: Secondary | ICD-10-CM | POA: Diagnosis not present

## 2022-07-05 DIAGNOSIS — M5117 Intervertebral disc disorders with radiculopathy, lumbosacral region: Secondary | ICD-10-CM | POA: Diagnosis not present

## 2022-07-05 DIAGNOSIS — M4726 Other spondylosis with radiculopathy, lumbar region: Secondary | ICD-10-CM | POA: Diagnosis not present

## 2022-07-09 DIAGNOSIS — M5416 Radiculopathy, lumbar region: Secondary | ICD-10-CM | POA: Diagnosis not present

## 2022-07-22 DIAGNOSIS — M5416 Radiculopathy, lumbar region: Secondary | ICD-10-CM | POA: Diagnosis not present

## 2022-08-25 DIAGNOSIS — M12812 Other specific arthropathies, not elsewhere classified, left shoulder: Secondary | ICD-10-CM | POA: Diagnosis not present

## 2022-08-25 DIAGNOSIS — M75102 Unspecified rotator cuff tear or rupture of left shoulder, not specified as traumatic: Secondary | ICD-10-CM | POA: Diagnosis not present

## 2022-08-25 DIAGNOSIS — S46212S Strain of muscle, fascia and tendon of other parts of biceps, left arm, sequela: Secondary | ICD-10-CM | POA: Diagnosis not present

## 2022-09-24 DIAGNOSIS — K219 Gastro-esophageal reflux disease without esophagitis: Secondary | ICD-10-CM | POA: Diagnosis not present

## 2022-09-24 DIAGNOSIS — N182 Chronic kidney disease, stage 2 (mild): Secondary | ICD-10-CM | POA: Diagnosis not present

## 2022-09-24 DIAGNOSIS — I252 Old myocardial infarction: Secondary | ICD-10-CM | POA: Diagnosis not present

## 2022-09-24 DIAGNOSIS — D6869 Other thrombophilia: Secondary | ICD-10-CM | POA: Diagnosis not present

## 2022-09-24 DIAGNOSIS — E1122 Type 2 diabetes mellitus with diabetic chronic kidney disease: Secondary | ICD-10-CM | POA: Diagnosis not present

## 2022-09-24 DIAGNOSIS — I25119 Atherosclerotic heart disease of native coronary artery with unspecified angina pectoris: Secondary | ICD-10-CM | POA: Diagnosis not present

## 2022-09-24 DIAGNOSIS — Z7982 Long term (current) use of aspirin: Secondary | ICD-10-CM | POA: Diagnosis not present

## 2022-09-24 DIAGNOSIS — I129 Hypertensive chronic kidney disease with stage 1 through stage 4 chronic kidney disease, or unspecified chronic kidney disease: Secondary | ICD-10-CM | POA: Diagnosis not present

## 2022-09-24 DIAGNOSIS — K5900 Constipation, unspecified: Secondary | ICD-10-CM | POA: Diagnosis not present

## 2022-09-24 DIAGNOSIS — I4891 Unspecified atrial fibrillation: Secondary | ICD-10-CM | POA: Diagnosis not present

## 2022-09-24 DIAGNOSIS — E785 Hyperlipidemia, unspecified: Secondary | ICD-10-CM | POA: Diagnosis not present

## 2022-09-24 DIAGNOSIS — M1990 Unspecified osteoarthritis, unspecified site: Secondary | ICD-10-CM | POA: Diagnosis not present

## 2022-10-29 DIAGNOSIS — M75102 Unspecified rotator cuff tear or rupture of left shoulder, not specified as traumatic: Secondary | ICD-10-CM | POA: Diagnosis not present

## 2022-10-29 DIAGNOSIS — M12812 Other specific arthropathies, not elsewhere classified, left shoulder: Secondary | ICD-10-CM | POA: Diagnosis not present

## 2022-10-29 DIAGNOSIS — S46212S Strain of muscle, fascia and tendon of other parts of biceps, left arm, sequela: Secondary | ICD-10-CM | POA: Diagnosis not present

## 2022-11-20 DIAGNOSIS — M5412 Radiculopathy, cervical region: Secondary | ICD-10-CM | POA: Diagnosis not present

## 2022-11-20 DIAGNOSIS — M542 Cervicalgia: Secondary | ICD-10-CM | POA: Diagnosis not present

## 2022-12-05 DIAGNOSIS — M4802 Spinal stenosis, cervical region: Secondary | ICD-10-CM | POA: Diagnosis not present

## 2022-12-05 DIAGNOSIS — M5412 Radiculopathy, cervical region: Secondary | ICD-10-CM | POA: Diagnosis not present

## 2022-12-05 DIAGNOSIS — M5030 Other cervical disc degeneration, unspecified cervical region: Secondary | ICD-10-CM | POA: Diagnosis not present

## 2022-12-05 DIAGNOSIS — M4803 Spinal stenosis, cervicothoracic region: Secondary | ICD-10-CM | POA: Diagnosis not present

## 2022-12-05 DIAGNOSIS — M47812 Spondylosis without myelopathy or radiculopathy, cervical region: Secondary | ICD-10-CM | POA: Diagnosis not present

## 2022-12-10 DIAGNOSIS — M5416 Radiculopathy, lumbar region: Secondary | ICD-10-CM | POA: Diagnosis not present

## 2022-12-10 DIAGNOSIS — M5412 Radiculopathy, cervical region: Secondary | ICD-10-CM | POA: Diagnosis not present

## 2022-12-17 ENCOUNTER — Telehealth: Payer: Self-pay | Admitting: Cardiology

## 2022-12-17 NOTE — Telephone Encounter (Signed)
Pt's wife needs to speak with a nurse regarding some paperwork that was faxed over. Please advise

## 2022-12-18 NOTE — Telephone Encounter (Signed)
Wife called stating patient has a blocked artery in his neck and wants a call back to discuss next steps.

## 2022-12-19 ENCOUNTER — Telehealth: Payer: Self-pay

## 2022-12-19 NOTE — Telephone Encounter (Signed)
Patient's wife called the office and asked if we had completed the cardiac clearance form for [facility] in [place]. After looking all over the office we were unable to find the form she was asking about. I called her and asked her to bring the form by the office again and we would process it so the patient could be cleared by cardiology for his procedure. Scheryl Marten was agreeable with this plan and stated that she would drop off the form on Monday. Scheryl Marten had no further questions at this time.

## 2022-12-25 NOTE — Progress Notes (Signed)
Cardiology Office Note:  .   Date:  12/26/2022  ID:  Jaamal Sandra, DOB 1945-07-18, MRN [id number] PCP: Buckner Malta, MD  [facility] Providers Cardiologist:  Norman Herrlich, MD    History of Present Illness: .   Cameo Stilson is a 77 y.o. male with a past medical history of CAD s/p CABG x 3, PAF not on DOAC, GERD, aortic regurgitation, dyslipidemia, RBBB, elevated LPa.  05/15/2021 TEE EF 50 to 55% low normal systolic function, mild MR, moderate aortic valve regurgitation 05/15/2021 CABG x 3 LIMA to LAD, R SVG to PDA, OM1 05/08/2021 left heart cath severe left main and triple-vessel CAD >> recommendations for CVTS consultation 07/29/2021 coronary CTA revealed severe multivessel flow-limiting disease with recommendation for left heart cath  Most recently evaluated by Dr. Dulce Sellar on 05/30/2022, was doing well from a cardiac perspective, was advised to follow-up in 1 year.  He presents today by his wife for follow-up after recent testing performed by Ortho care.  He apparently had upcoming neck injections that were scheduled for ongoing pain, he had an MRI of his neck which revealed abnormal signal in the left vertebral artery with stenosis versus occlusion.  They were advised to follow-up with cardiology as soon as possible, which is the reason for their visit today.  Apparently, Ortho care had already reached out to [facility] vascular as well as interventional radiology at [facility] but and not communicated this with the family.  Our staff spent approximately 90 minutes on the phone today trying to sort out what had already been arranged.  He is asymptomatic, offers no formal complaints, understandably frustrated at the lack of communication that has been going on. He denies chest pain, palpitations, dyspnea, pnd, orthopnea, n, v, dizziness, syncope, edema, weight gain, or early satiety.   ROS: Review of Systems  Musculoskeletal:  Positive for neck pain.  All other systems reviewed and are  negative.    Studies Reviewed: Marland Kitchen   EKG Interpretation Date/Time:  Friday December 26 2022 10:05:24 EST Ventricular Rate:  81 PR Interval:  160 QRS Duration:  112 QT Interval:  380 QTC Calculation: 441 R Axis:   89  Text Interpretation: Normal sinus rhythm -- no changes Incomplete right bundle branch block Nonspecific T wave abnormality Abnormal ECG When compared with ECG of 17-May-2021 05:41, Sinus rhythm has replaced Atrial fibrillation Vent. rate has decreased BY 108 BPM ST no longer depressed in Anterior leads Confirmed by Wallis Bamberg [phone]) on 12/26/2022 9:09:34 AM    Cardiac Studies & Procedures   CARDIAC CATHETERIZATION  CARDIAC CATHETERIZATION 05/08/2021  Narrative   Prox RCA lesion is 80% stenosed.   Prox RCA to Mid RCA lesion is 50% stenosed.   Dist RCA lesion is 70% stenosed.   2nd Mrg lesion is 70% stenosed.   Ost LM to Mid LM lesion is 70% stenosed.   Ost LAD to Prox LAD lesion is 80% stenosed.   Mid LAD-2 lesion is 90% stenosed.   Mid LAD-1 lesion is 90% stenosed.   2nd Diag lesion is 70% stenosed.   The left ventricular systolic function is normal.   LV end diastolic pressure is normal.   The left ventricular ejection fraction is greater than 65% by visual estimate.  Severe left main and triple vessel CAD 70% diffuse left main stenosis Severe, calcific disease in the proximal and mid LAD The Circumflex gives off a large obtuse marginal branch. The obtuse marginal branch has a severe stenosis. The RCA is a dominant vessel  with severe proximal and severe distal stenosis. Normal LV systolic function Normal LVEDP  Recommendations: He has severe triple vessel CAD involving the left main artery. I think the best revascularization strategy would be CABG. He is very functional and only has angina with moderate to heavy exertion. I will discharge him home today after an echo and consult CT surgery as an outpatient for CABG.  Findings Coronary Findings Diagnostic   Dominance: Right  Left Main Ost LM to Mid LM lesion is 70% stenosed.  Left Anterior Descending Vessel is large. Ost LAD to Prox LAD lesion is 80% stenosed. Mid LAD-1 lesion is 90% stenosed. The lesion is calcified. Mid LAD-2 lesion is 90% stenosed. The lesion is calcified.  Second Diagonal Branch 2nd Diag lesion is 70% stenosed.  Left Circumflex Vessel is large.  Second Obtuse Marginal Branch Vessel is large in size. 2nd Mrg lesion is 70% stenosed.  Right Coronary Artery Vessel is large. Prox RCA lesion is 80% stenosed. Prox RCA to Mid RCA lesion is 50% stenosed. Dist RCA lesion is 70% stenosed.  Intervention  No interventions have been documented.    ECHOCARDIOGRAM  ECHOCARDIOGRAM COMPLETE 05/08/2021  Narrative ECHOCARDIOGRAM REPORT    Patient Name:   Keaundre Mark Date of Exam: 05/08/2021 Medical Rec #:  [id number]    Height:       70.0 in Accession #:    [id number]   Weight:       164.0 lb Date of Birth:  [birth year]    BSA:          1.918 m Patient Age:    76 years     BP:           132/55 mmHg Patient Gender: M            HR:           65 bpm. Exam Location:  Inpatient  Procedure: 2D Echo, Cardiac Doppler and Color Doppler  Indications:    Preop  History:        Patient has no prior history of Echocardiogram examinations. CAD.  Sonographer:    Cleatis Polka Referring Phys: [id number] CHRISTOPHER D MCALHANY  IMPRESSIONS   1. Left ventricular ejection fraction, by estimation, is 60 to 65%. The left ventricle has normal function. The left ventricle has no regional wall motion abnormalities. Left ventricular diastolic parameters are consistent with Grade I diastolic dysfunction (impaired relaxation). 2. Right ventricular systolic function is normal. The right ventricular size is normal. 3. The mitral valve is normal in structure. Trivial mitral valve regurgitation. No evidence of mitral stenosis. 4. The aortic valve is normal in structure. Aortic valve  regurgitation is mild to moderate. No aortic stenosis is present. 5. The inferior vena cava is normal in size with greater than 50% respiratory variability, suggesting right atrial pressure of 3 mmHg.  FINDINGS Left Ventricle: Left ventricular ejection fraction, by estimation, is 60 to 65%. The left ventricle has normal function. The left ventricle has no regional wall motion abnormalities. The left ventricular internal cavity size was normal in size. There is no left ventricular hypertrophy. Left ventricular diastolic parameters are consistent with Grade I diastolic dysfunction (impaired relaxation).  Right Ventricle: The right ventricular size is normal. No increase in right ventricular wall thickness. Right ventricular systolic function is normal.  Left Atrium: Left atrial size was normal in size.  Right Atrium: Right atrial size was normal in size.  Pericardium: There is no evidence of pericardial effusion.  Mitral Valve: The  mitral valve is normal in structure. Trivial mitral valve regurgitation. No evidence of mitral valve stenosis.  Tricuspid Valve: The tricuspid valve is normal in structure. Tricuspid valve regurgitation is not demonstrated. No evidence of tricuspid stenosis.  Aortic Valve: The aortic valve is normal in structure. Aortic valve regurgitation is mild to moderate. Aortic regurgitation PHT measures 506 msec. No aortic stenosis is present. Aortic valve peak gradient measures 4.8 mmHg.  Pulmonic Valve: The pulmonic valve was normal in structure. Pulmonic valve regurgitation is not visualized. No evidence of pulmonic stenosis.  Aorta: The aortic root is normal in size and structure.  Venous: The inferior vena cava is normal in size with greater than 50% respiratory variability, suggesting right atrial pressure of 3 mmHg.  IAS/Shunts: No atrial level shunt detected by color flow Doppler.   LEFT VENTRICLE PLAX 2D LVIDd:         4.20 cm     Diastology LVIDs:          2.80 cm     LV e' medial:    5.66 cm/s LV PW:         0.90 cm     LV E/e' medial:  11.8 LV IVS:        1.00 cm     LV e' lateral:   8.27 cm/s LVOT diam:     2.00 cm     LV E/e' lateral: 8.1 LV SV:         70 LV SV Index:   37 LVOT Area:     3.14 cm  LV Volumes (MOD) LV vol d, MOD A2C: 93.6 ml LV vol d, MOD A4C: 89.0 ml LV vol s, MOD A2C: 36.2 ml LV vol s, MOD A4C: 34.3 ml LV SV MOD A2C:     57.4 ml LV SV MOD A4C:     89.0 ml LV SV MOD BP:      57.9 ml  RIGHT VENTRICLE            IVC RV Basal diam:  2.80 cm    IVC diam: 1.70 cm RV Mid diam:    2.70 cm RV S prime:     7.72 cm/s TAPSE (M-mode): 1.4 cm  LEFT ATRIUM             Index        RIGHT ATRIUM           Index LA diam:        3.60 cm 1.88 cm/m   RA Area:     11.90 cm LA Vol (A2C):   37.6 ml 19.60 ml/m  RA Volume:   23.80 ml  12.41 ml/m LA Vol (A4C):   49.8 ml 25.96 ml/m LA Biplane Vol: 43.2 ml 22.52 ml/m AORTIC VALVE AV Area (Vmax): 2.63 cm AV Vmax:        109.00 cm/s AV Peak Grad:   4.8 mmHg LVOT Vmax:      91.40 cm/s LVOT Vmean:     61.600 cm/s LVOT VTI:       0.223 m AI PHT:         506 msec  AORTA Ao Root diam: 3.40 cm Ao Asc diam:  3.10 cm  MITRAL VALVE               TRICUSPID VALVE MV Area (PHT): 4.01 cm    TR Peak grad:   21.9 mmHg MV Decel Time: 189 msec    TR Vmax:  234.00 cm/s MV E velocity: 66.80 cm/s MV A velocity: 71.10 cm/s  SHUNTS MV E/A ratio:  0.94        Systemic VTI:  0.22 m Systemic Diam: 2.00 cm  Donato Schultz MD Electronically signed by Donato Schultz MD Signature Date/Time: 05/08/2021/2:25:40 PM    Final  TEE  ECHO INTRAOPERATIVE TEE 05/15/2021  Narrative *INTRAOPERATIVE TRANSESOPHAGEAL REPORT *    Patient Name:   Jarmal Bey   Date of Exam: 05/15/2021 Medical Rec #:  [id number]      Height:       70.0 in Accession #:    [id number]     Weight:       167.0 lb Date of Birth:  1945-06-12      BSA:          1.93 m Patient Age:    76 years       BP:           121/70  mmHg Patient Gender: M              HR:           58 bpm. Exam Location:  Anesthesiology  Transesophogeal exam was perform intraoperatively during surgical procedure. Patient was closely monitored under general anesthesia during the entirety of examination.  Indications:     Coronary Artery Disease Sonographer:     Eulah Pont RDCS Performing Phys: [id number] Eliezer Lofts LIGHTFOOT Diagnosing Phys: Dorris Singh MD  POST-OP IMPRESSIONS _ Left Ventricle: Post Bypass: The patient came off bypass on the first attempt. Left ventricular contraction did improve with volume replacement. There did not appear to be any new findings from preop exam. The TEE that had been placed after induction uneventfully was removed at the end of the case without difficulty. The patient was later taken to the SICU in stable condition.  PRE-OP FINDINGS Left Ventricle: The left ventricle has low normal systolic function, with an ejection fraction of 50-55%.   Right Ventricle: The right ventricle has normal systolic function.  Left Atrium: Left atrial size was normal in size. No left atrial/left atrial appendage thrombus was detected.  Mitral Valve: Mitral valve regurgitation is mild by color flow Doppler.  Tricuspid Valve: The tricuspid valve was normal in structure.  Aortic Valve: The aortic valve is tricuspid Aortic valve regurgitation is moderate by color flow Doppler.   Pulmonic Valve: The pulmonic valve was normal in structure.     Dorris Singh MD Electronically signed by Dorris Singh MD Signature Date/Time: 05/15/2021/4:07:02 PM   Final   CT SCANS  CT CORONARY FRACTIONAL FLOW RESERVE DATA PREP 04/30/2021  Narrative EXAM: FFRCT ANALYSIS  FINDINGS: FFRct analysis was performed on the original cardiac CT angiogram dataset. Diagrammatic representation of the FFRct analysis is provided in a separate PDF document in PACS. This dictation was created using the PDF document and an  interactive 3D model of the results. 3D model is not available in the EMR/PACS. Normal FFR range is >0.80.  1. Left Main: 0.99 proximal, 0.87 distal  2. LAD: 0.75 proximal, 0.54 mid, <0.5 distal  3. LCX:0.87 proximal, 0.81 mid, 0.67 distal  4. Ramus: Too small caliber to calculate  5. RCA: Proximal 0.64, mid 0.54, distal <0.5  IMPRESSION: Severe multivessel flow limiting disease. Recommend cardiac catheterization.  Note: These examples are not recommendations of [employer] and only provided as examples of what other customers are doing.   Electronically Signed By: Donato Schultz M.D. On: 04/30/2021 16:49   CT CORONARY MORPH  W/CTA COR W/SCORE 04/30/2021  Addendum 04/30/2021 12:26 PM ADDENDUM REPORT: 04/30/2021 12:02  CLINICAL DATA:  77 year old with chest pain  EXAM: Cardiac/Coronary  CTA  TECHNIQUE: The patient was scanned on a [employer].  FINDINGS: A 120 kV prospective scan was triggered in the descending thoracic aorta at 111 HU's. Axial non-contrast 3 mm slices were carried out through the heart. The data set was analyzed on a dedicated work station and scored using the Agatson method. Gantry rotation speed was 250 msecs and collimation was .6 mm. 0.8 mg of sl NTG was given. The 3D data set was reconstructed in 5% intervals of the 67-82 % of the R-R cycle. Diastolic phases were analyzed on a dedicated work station using MPR, MIP and VRT modes. The patient received 80 cc of contrast.  Image quality: good  Aorta:  Normal size.  Aortic atherosclerosis.  No dissection.  Aortic Valve:  Trileaflet.  No calcifications.  Coronary Arteries:  Normal coronary origin.  Right dominance.  RCA is a large dominant artery that gives rise to PDA and PLA. There is diffuse calcified plaque, high proximal severe stenosis (0.63 FFR) with low attenuation plaque.  Left main is a large artery that gives rise to LAD and LCX arteries.  LAD is a large vessel that has  diffuse calcified plaque, severe proximal stenosis (FFR 0.57).  LCX is a non-dominant artery that gives rise to one large OM1 branch. There is diffuse calcified plaque, (FFR 0.67) in mid region consistent with severe stenosis.  Other findings:  Normal pulmonary vein drainage into the left atrium.  Normal left atrial appendage without a thrombus.  Normal size of the pulmonary artery.  Please see radiology report for non cardiac findings.  IMPRESSION: 1. Unable to calculate calcium score due technical issue. Appears severely elevated.  2. Normal coronary origin with right dominance.  3. Severe multivessel CAD with FFR abnormality. Recommend cardiac catheterization.   Electronically Signed By: Donato Schultz M.D. On: 04/30/2021 12:02  Narrative EXAM: OVER-READ INTERPRETATION  CT CHEST  The following report is an over-read performed by radiologist Dr. Jeronimo Greaves of [facility] on 04/30/2021. This over-read does not include interpretation of cardiac or coronary anatomy or pathology. The coronary CTA interpretation by the cardiologist is attached.  COMPARISON:  None.  FINDINGS: Vascular: Aortic atherosclerosis. No central pulmonary embolism, on this non-dedicated study.  Mediastinum/Nodes: No imaged thoracic adenopathy. Mild esophageal dilatation with fluid level within on 04/10.  Lungs/Pleura: No pleural fluid.  Clear imaged lungs.  Upper Abdomen: Normal imaged portions of the liver, spleen, stomach.  Musculoskeletal: No acute osseous abnormality.  IMPRESSION: No acute findings in the imaged extracardiac chest.  Mild esophageal dilatation. Air fluid level suggests dysmotility or gastroesophageal reflux.  Aortic Atherosclerosis (ICD10-I70.0).  Electronically Signed: By: Jeronimo Greaves M.D. On: 04/30/2021 09:08          Risk Assessment/Calculations:    CHA2DS2-VASc Score = 3   This indicates a 3.2% annual risk of stroke. The patient's score  is based upon: CHF History: 0 HTN History: 0 Diabetes History: 0 Stroke History: 0 Vascular Disease History: 1 Age Score: 2 Gender Score: 0            Physical Exam:   VS:  BP 121/73 (BP Location: Left Arm, Patient Position: Sitting, Cuff Size: Normal)   Pulse 81   Ht 5\' 10"  (1.778 m)   Wt 178 lb (80.7 kg)   SpO2 98%   BMI 25.54 kg/m  Wt Readings from Last 3 Encounters:  12/26/22 178 lb (80.7 kg)  05/30/22 168 lb (76.2 kg)  10/16/21 170 lb (77.1 kg)    GEN: Well nourished, well developed in no acute distress NECK: No JVD; No carotid bruits CARDIAC: RRR, no murmurs, rubs, gallops RESPIRATORY:  Clear to auscultation without rales, wheezing or rhonchi  ABDOMEN: Soft, non-tender, non-distended EXTREMITIES:  No edema; No deformity   ASSESSMENT AND PLAN: .   CAD -s/p CABG x 3 in 2023, Stable with no anginal symptoms. No indication for ischemic evaluation.  Continue aspirin 81 mg daily, continue Crestor 20 mg daily.  Elevated LP(a) -very elevated at 175.8, repeating LFTs and fasting lipid panel--will likely increase his Crestor to 40 mg daily.  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  Again, much confusion surrounding who he was post to follow-up with--family was told to follow-up with cardiology immediately so we could arrange further follow-up however it appears that Ortho care also reached out to vascular and IR but the family has heard nothing from either discipline.  I am just going to make an urgent referral to vascular in [facility] to see if we can get him seen quicker.  He has no neurological symptoms--denies dizziness, vertigo, syncope, TIA symptoms.  Carotid artery stenosis - carotid duplex in 2023 as per the preoperative process for his CABG revealed mild bilateral stenosis.        Dispo: Refer to vascular for evaluation of  left vertebral artery  occlusion. Labs per above.   Signed, Flossie Dibble, NP

## 2022-12-26 ENCOUNTER — Other Ambulatory Visit: Payer: Self-pay

## 2022-12-26 ENCOUNTER — Encounter: Payer: Self-pay | Admitting: Cardiology

## 2022-12-26 ENCOUNTER — Ambulatory Visit: Payer: Medicare HMO | Attending: Cardiology | Admitting: Cardiology

## 2022-12-26 VITALS — BP 121/73 | HR 81 | Ht 70.0 in | Wt 178.0 lb

## 2022-12-26 DIAGNOSIS — Z951 Presence of aortocoronary bypass graft: Secondary | ICD-10-CM | POA: Diagnosis not present

## 2022-12-26 DIAGNOSIS — I25118 Atherosclerotic heart disease of native coronary artery with other forms of angina pectoris: Secondary | ICD-10-CM

## 2022-12-26 DIAGNOSIS — E7841 Elevated Lipoprotein(a): Secondary | ICD-10-CM | POA: Diagnosis not present

## 2022-12-26 DIAGNOSIS — I6509 Occlusion and stenosis of unspecified vertebral artery: Secondary | ICD-10-CM

## 2022-12-26 DIAGNOSIS — I6502 Occlusion and stenosis of left vertebral artery: Secondary | ICD-10-CM

## 2022-12-26 NOTE — Patient Instructions (Signed)
Medication Instructions:  Your physician recommends that you continue on your current medications as directed. Please refer to the Current Medication list given to you today.  *If you need a refill on your cardiac medications before your next appointment, please call your pharmacy*   Lab Work: Your physician recommends that you have labs done in the office today. Your test included  complete metabolic panel, complete blood count, and a direct LDL.   If you have labs (blood work) drawn today and your tests are completely normal, you will receive your results only by: MyChart Message (if you have MyChart) OR A paper copy in the mail If you have any lab test that is abnormal or we need to change your treatment, we will call you to review the results.   Testing/Procedures: None Ordered   Follow-Up: At [facility], you and your health needs are our priority.  As part of our continuing mission to provide you with exceptional heart care, we have created designated Provider Care Teams.  These Care Teams include your primary Cardiologist (physician) and Advanced Practice Providers (APPs -  Physician Assistants and Nurse Practitioners) who all work together to provide you with the care you need, when you need it.  We recommend signing up for the patient portal called "MyChart".  Sign up information is provided on this After Visit Summary.  MyChart is used to connect with patients for Virtual Visits (Telemedicine).  Patients are able to view lab/test results, encounter notes, upcoming appointments, etc.  Non-urgent messages can be sent to your provider as well.   To learn more about what you can do with MyChart, go to [email or web address].    Your next appointment:   Keep follow up with Dr. Dulce Sellar

## 2022-12-27 LAB — CBC
Hematocrit: 45.7 % (ref 37.5–51.0)
Hemoglobin: 14.9 g/dL (ref 13.0–17.7)
MCH: 29.9 pg (ref 26.6–33.0)
MCHC: 32.6 g/dL (ref 31.5–35.7)
MCV: 92 fL (ref 79–97)
Platelets: 157 x10E3/uL (ref 150–450)
RBC: 4.98 x10E6/uL (ref 4.14–5.80)
RDW: 12.1 % (ref 11.6–15.4)
WBC: 7 x10E3/uL (ref 3.4–10.8)

## 2022-12-27 LAB — COMPREHENSIVE METABOLIC PANEL WITH GFR
ALT: 20 IU/L (ref 0–44)
AST: 23 IU/L (ref 0–40)
Albumin: 4.1 g/dL (ref 3.8–4.8)
Alkaline Phosphatase: 87 IU/L (ref 44–121)
BUN/Creatinine Ratio: 19 (ref 10–24)
BUN: 13 mg/dL (ref 8–27)
Bilirubin Total: 0.3 mg/dL (ref 0.0–1.2)
CO2: 24 mmol/L (ref 20–29)
Calcium: 8.8 mg/dL (ref 8.6–10.2)
Chloride: 105 mmol/L (ref 96–106)
Creatinine, Ser: 0.7 mg/dL — ABNORMAL LOW (ref 0.76–1.27)
Globulin, Total: 2.1 g/dL (ref 1.5–4.5)
Glucose: 97 mg/dL (ref 70–99)
Potassium: 4.2 mmol/L (ref 3.5–5.2)
Sodium: 142 mmol/L (ref 134–144)
Total Protein: 6.2 g/dL (ref 6.0–8.5)
eGFR: 94 mL/min/1.73

## 2022-12-27 LAB — LDL CHOLESTEROL, DIRECT: LDL Direct: 89 mg/dL (ref 0–99)

## 2022-12-29 ENCOUNTER — Telehealth: Payer: Self-pay | Admitting: Emergency Medicine

## 2022-12-29 DIAGNOSIS — I25118 Atherosclerotic heart disease of native coronary artery with other forms of angina pectoris: Secondary | ICD-10-CM

## 2022-12-29 MED ORDER — ROSUVASTATIN CALCIUM 40 MG PO TABS: 40.0000 mg | ORAL_TABLET | Freq: Every day | ORAL | 3 refills | Status: DC

## 2022-12-29 NOTE — Telephone Encounter (Signed)
-----   Message from Flossie Dibble sent at 12/27/2022  5:34 PM EST ----- Electrolytes are normal. Liver function testing is good. Bad cholesterol is 89, we refer this to be lower and he also has an elevated LPA--recommend increasing his Crestor to 40 mg daily.  FLP and LFTs in 8 weeks.

## 2022-12-29 NOTE — Telephone Encounter (Signed)
Spoke to patients wife, Scheryl Marten per [place]. Reviewed results with her. She verbalized understanding and has no further questions. Rx sent to pharmacy.  Routed to PCP.

## 2023-01-05 ENCOUNTER — Other Ambulatory Visit: Payer: Self-pay | Admitting: *Deleted

## 2023-01-05 DIAGNOSIS — R0989 Other specified symptoms and signs involving the circulatory and respiratory systems: Secondary | ICD-10-CM

## 2023-01-12 ENCOUNTER — Ambulatory Visit (HOSPITAL_COMMUNITY)
Admission: RE | Admit: 2023-01-12 | Discharge: 2023-01-12 | Disposition: A | Discharge status: Home / Self Care | Payer: Medicare HMO | Source: Ambulatory Visit | Attending: Surgery | Admitting: Surgery

## 2023-01-12 DIAGNOSIS — R0989 Other specified symptoms and signs involving the circulatory and respiratory systems: Secondary | ICD-10-CM | POA: Diagnosis not present

## 2023-02-09 ENCOUNTER — Ambulatory Visit (INDEPENDENT_AMBULATORY_CARE_PROVIDER_SITE_OTHER): Payer: Medicare HMO | Admitting: Surgery

## 2023-02-09 ENCOUNTER — Encounter: Payer: Self-pay | Admitting: Surgery

## 2023-02-09 VITALS — BP 111/66 | HR 82 | Temp 97.9°F | Resp 20 | Ht 70.0 in | Wt 176.0 lb

## 2023-02-09 DIAGNOSIS — I6502 Occlusion and stenosis of left vertebral artery: Secondary | ICD-10-CM

## 2023-02-09 NOTE — Progress Notes (Signed)
Vascular and Vein Specialist of [facility]  Patient name: Derek Wood MRN: [id number] DOB: [birth year] Sex: male   REQUESTING PROVIDER:    Wallis Bamberg   REASON FOR CONSULT:    Vertebral stenosis  HISTORY OF PRESENT ILLNESS:   Derek Wood is a 78 y.o. 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  He was being scheduled for neck injections and had a MRI that showed an abnormal signal in the left vertebral artery with stenosis versus occlusion.  He denies any significant balance issues.  He has not had a stroke.  He does have numbness in his left arm which is chronic.  He denies any other neurologic symptoms, unrelated to his back issues.   The patient suffers from coronary artery disease, status post CABG. he is a non-smoker.  He takes a statin for hypercholesterolemia  PAST MEDICAL HISTORY    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     FAMILY HISTORY   Family History  Problem Relation Age of Onset   Cancer Mother    Heart attack Father    Heart disease Brother    Diabetes Brother     SOCIAL HISTORY:   Social History   Socioeconomic History   Marital status: Married    Spouse name: Not on file   Number of children: Not on file   Years of education: Not on file   Highest education level: Not on file  Occupational History   Not on file  Tobacco Use   Smoking status: Never    Passive exposure: Never   Smokeless tobacco: Never  Vaping Use   Vaping status: Never Used  Substance and Sexual Activity   Alcohol use: Not Currently   Drug use: Never    Sexual activity: Not on file  Other Topics Concern   Not on file  Social History Narrative   Not on file   Social Drivers of Health   Financial Resource Strain: Not on file  Food Insecurity: Not on file  Transportation Needs: Not on file  Physical Activity: Not on file  Stress: Not on file  Social Connections: Not on file  Intimate Partner Violence: Not on file    ALLERGIES:    Allergies  Allergen Reactions   Oxycodone     Other reaction(s): Other (See Comments) "made me feel funny"   Statins Other (See Comments)    Myalgia    CURRENT MEDICATIONS:    Current Outpatient Medications  Medication Sig Dispense Refill   acetaminophen (TYLENOL) 325 MG tablet Take 650 mg by mouth every 6 (six) hours as needed.     aspirin EC 81 MG tablet Take 1 tablet (81 mg total) by mouth daily. Swallow whole. 90 tablet 3   Coenzyme Q10 100 MG capsule Take 100 mg by mouth daily.     gabapentin (NEURONTIN) 300 MG capsule Take 300 mg by mouth daily as needed (pain).     metoprolol succinate (TOPROL-XL) 25 MG 24 hr tablet Take 0.5 tablets (12.5 mg total) by mouth daily. 45 tablet 3   Multiple Vitamin (ONE-DAILY  MULTI-VITAMIN PO) Take 1 tablet by mouth daily.     omeprazole (PRILOSEC) 20 MG capsule Take 20 mg by mouth daily.     rosuvastatin (CRESTOR) 40 MG tablet Take 1 tablet (40 mg total) by mouth daily. 90 tablet 3   No current facility-administered medications for this visit.    REVIEW OF SYSTEMS:   [X]  denotes positive finding, [ ]  denotes negative finding Cardiac  Comments:  Chest pain or chest pressure:    Shortness of breath upon exertion:    Short of breath when lying flat:    Irregular heart rhythm:        Vascular    Pain in calf, thigh, or hip brought on by ambulation:    Pain in feet at night that wakes you up from your sleep:     Blood clot in your veins:    Leg swelling:         Pulmonary    Oxygen at home:    Productive cough:     Wheezing:         Neurologic     Sudden weakness in arms or legs:     Sudden numbness in arms or legs:     Sudden onset of difficulty speaking or slurred speech:    Temporary loss of vision in one eye:     Problems with dizziness:         Gastrointestinal    Blood in stool:      Vomited blood:         Genitourinary    Burning when urinating:     Blood in urine:        Psychiatric    Major depression:         Hematologic    Bleeding problems:    Problems with blood clotting too easily:        Skin    Rashes or ulcers:        Constitutional    Fever or chills:     PHYSICAL EXAM:   Vitals:   02/09/23 1102 02/09/23 1104  BP: 104/67 111/66  Pulse: 82   Resp: 20   Temp: 97.9 F (36.6 C)   SpO2: 97%   Weight: 176 lb (79.8 kg)   Height: 5\' 10"  (1.778 m)     GENERAL: The patient is a well-nourished male, in no acute distress. The vital signs are documented above. CARDIAC: There is a regular rate and rhythm.  VASCULAR: Palpable bilateral radial pulses PULMONARY: Nonlabored respirations MUSCULOSKELETAL: There are no major deformities or cyanosis. NEUROLOGIC: No focal weakness or paresthesias are detected. SKIN: There are no ulcers or rashes noted. PSYCHIATRIC: The patient has a normal affect.  STUDIES:   I have reviewed the following carotid duplex: Right Carotid: Velocities in the right ICA are consistent with a 1-39%  stenosis.   Left Carotid: Velocities in the left ICA are consistent with a 1-39%  stenosis.   Vertebrals: Right vertebral artery demonstrates antegrade flow. Left  vertebral              artery demonstrates retrograde flow.  Subclavians: Normal flow hemodynamics were seen in bilateral subclavian               arteries.    ASSESSMENT and PLAN   Left vertebral artery stenosis: The patient is asymptomatic.  Ultrasound shows no significant carotid disease.  He does have retrograde flow in his left vertebral artery.  No intervention is recommended at this time.  He can proceed  with orthopedic spine injections.  He will follow-up in 1 year with repeat surveillance ultrasound.   Charlena Cross, MD, FACS [facility] and Vein Specialists of [facility] [phone] Pager [phone]

## 2023-02-16 ENCOUNTER — Encounter: Payer: Medicare HMO | Admitting: Surgery

## 2023-02-20 ENCOUNTER — Other Ambulatory Visit: Payer: Self-pay

## 2023-02-20 DIAGNOSIS — R0989 Other specified symptoms and signs involving the circulatory and respiratory systems: Secondary | ICD-10-CM

## 2023-02-20 DIAGNOSIS — M5412 Radiculopathy, cervical region: Secondary | ICD-10-CM | POA: Diagnosis not present

## 2023-03-02 DIAGNOSIS — I25118 Atherosclerotic heart disease of native coronary artery with other forms of angina pectoris: Secondary | ICD-10-CM | POA: Diagnosis not present

## 2023-03-03 LAB — HEPATIC FUNCTION PANEL
ALT: 20 IU/L (ref 0–44)
AST: 21 IU/L (ref 0–40)
Albumin: 4.1 g/dL (ref 3.8–4.8)
Alkaline Phosphatase: 92 IU/L (ref 44–121)
Bilirubin Total: 0.4 mg/dL (ref 0.0–1.2)
Bilirubin, Direct: 0.14 mg/dL (ref 0.00–0.40)
Total Protein: 6.4 g/dL (ref 6.0–8.5)

## 2023-03-03 LAB — LIPID PANEL
Chol/HDL Ratio: 3.3 ratio (ref 0.0–5.0)
Cholesterol, Total: 144 mg/dL (ref 100–199)
HDL: 43 mg/dL
LDL Chol Calc (NIH): 77 mg/dL (ref 0–99)
Triglycerides: 139 mg/dL (ref 0–149)
VLDL Cholesterol Cal: 24 mg/dL (ref 5–40)

## 2023-03-06 DIAGNOSIS — M5412 Radiculopathy, cervical region: Secondary | ICD-10-CM | POA: Diagnosis not present

## 2023-03-06 DIAGNOSIS — M5416 Radiculopathy, lumbar region: Secondary | ICD-10-CM | POA: Diagnosis not present

## 2023-03-10 DIAGNOSIS — L570 Actinic keratosis: Secondary | ICD-10-CM | POA: Diagnosis not present

## 2023-03-19 DIAGNOSIS — J40 Bronchitis, not specified as acute or chronic: Secondary | ICD-10-CM | POA: Diagnosis not present

## 2023-03-19 DIAGNOSIS — J329 Chronic sinusitis, unspecified: Secondary | ICD-10-CM | POA: Diagnosis not present

## 2023-03-24 ENCOUNTER — Telehealth: Payer: Self-pay

## 2023-03-24 MED ORDER — METOPROLOL SUCCINATE ER 25 MG PO TB24
12.5000 mg | ORAL_TABLET | Freq: Every day | ORAL | 0 refills | Status: DC
Start: 2023-03-24 — End: 2023-05-25

## 2023-03-24 NOTE — Telephone Encounter (Signed)
 Prescription sent to pharmacy.

## 2023-03-27 DIAGNOSIS — M542 Cervicalgia: Secondary | ICD-10-CM | POA: Diagnosis not present

## 2023-03-27 DIAGNOSIS — M25642 Stiffness of left hand, not elsewhere classified: Secondary | ICD-10-CM | POA: Diagnosis not present

## 2023-04-06 DIAGNOSIS — M5412 Radiculopathy, cervical region: Secondary | ICD-10-CM | POA: Diagnosis not present

## 2023-05-01 DIAGNOSIS — M5412 Radiculopathy, cervical region: Secondary | ICD-10-CM | POA: Diagnosis not present

## 2023-05-21 DIAGNOSIS — G5602 Carpal tunnel syndrome, left upper limb: Secondary | ICD-10-CM | POA: Diagnosis not present

## 2023-05-25 ENCOUNTER — Ambulatory Visit

## 2023-05-25 ENCOUNTER — Ambulatory Visit: Payer: Medicare HMO | Admitting: Cardiology

## 2023-05-25 VITALS — BP 120/64 | HR 82 | Ht 69.0 in | Wt 172.8 lb

## 2023-05-25 DIAGNOSIS — I25118 Atherosclerotic heart disease of native coronary artery with other forms of angina pectoris: Secondary | ICD-10-CM | POA: Diagnosis not present

## 2023-05-25 DIAGNOSIS — Z951 Presence of aortocoronary bypass graft: Secondary | ICD-10-CM | POA: Diagnosis not present

## 2023-05-25 DIAGNOSIS — I351 Nonrheumatic aortic (valve) insufficiency: Secondary | ICD-10-CM

## 2023-05-25 DIAGNOSIS — E782 Mixed hyperlipidemia: Secondary | ICD-10-CM | POA: Diagnosis not present

## 2023-05-25 MED ORDER — METOPROLOL SUCCINATE ER 25 MG PO TB24: 12.5000 mg | ORAL_TABLET | Freq: Every day | ORAL | 3 refills | Status: DC

## 2023-05-25 NOTE — Assessment & Plan Note (Signed)
 Improved lipid panel 03-02-2023.  LDL 77, HDL 43. Continue Crestor  40 mg once daily.

## 2023-05-25 NOTE — Addendum Note (Signed)
 Addended by: Aurelio Leer I on: 05/25/2023 01:52 PM   Modules accepted: Orders

## 2023-05-25 NOTE — Addendum Note (Signed)
 Addended by: Aurelio Leer I on: 05/25/2023 01:54 PM   Modules accepted: Orders

## 2023-05-25 NOTE — Assessment & Plan Note (Signed)
 Abnormal cardiac CT imaging in 2023 followed by coronary angiogram showing left main and severe triple-vessel disease, s/p CABG LIMA-LAD, SVG-RPDA, SVG-OM1 05-07-2021. IntraOp TEE LVEF 50 to 55%.  Remains asymptomatic. Good functional status at baseline.  Continue aspirin  81 mg once daily.

## 2023-05-25 NOTE — Assessment & Plan Note (Signed)
 Moderate aortic insufficiency on IntraOp TEE May 2022. No follow-up echocardiogram since. Will schedule for 1 prior to next visit in 6 months.

## 2023-05-25 NOTE — Progress Notes (Signed)
 Cardiology Consultation:    Date:  05/25/2023   ID:  Derek Wood, DOB 1945-04-11, MRN [id number]  PCP:  Harvest Lineman, MD  Cardiologist:  Daymon Evans Kenzley Ke, MD   Referring MD: Harvest Lineman, MD   No chief complaint on file.    ASSESSMENT AND PLAN:   [name] 78 year old male with history of CAD initially noted when on cardiac CT coronary imaging subsequent cardiac cath May 2023 with severe left main and triple-vessel disease underwent CABG 05-15-2021 with LIMA-LAD, SVG-RPDA and SVG-OM1, with preserved LVEF 50 to 55% on IntraOp TEE May 2023, mild MR, moderate aortic insufficiency.  atrial fibrillation during perioperative phase,  GERD, dyslipidemia, right bundle branch block, left vertebral artery stenosis for which she had visit with vascular surgeon and underwent evaluation and will follow-up with them in 1 year, no significant abnormality.  On ultrasound carotids done recently in January 2025  Overall doing well here for follow-up visit today.  Problem List Items Addressed This Visit     Mixed hyperlipidemia   Improved lipid panel 03-02-2023.  LDL 77, HDL 43. Continue Crestor  40 mg once daily.       Relevant Medications   metoprolol  succinate (TOPROL -XL) 25 MG 24 hr tablet   Coronary artery disease of native artery of native heart with stable angina pectoris (HCC)   Abnormal cardiac CT imaging in 2023 followed by coronary angiogram showing left main and severe triple-vessel disease, s/p CABG LIMA-LAD, SVG-RPDA, SVG-OM1 05-07-2021. IntraOp TEE LVEF 50 to 55%.  Remains asymptomatic. Good functional status at baseline.  Continue aspirin  81 mg once daily.       Relevant Medications   metoprolol  succinate (TOPROL -XL) 25 MG 24 hr tablet   Aortic regurgitation - Primary   Moderate aortic insufficiency on IntraOp TEE May 2022. No follow-up echocardiogram since. Will schedule for 1 prior to next visit in 6 months.       Relevant Medications   metoprolol   succinate (TOPROL -XL) 25 MG 24 hr tablet   S/P CABG x 3    Return to clinic in 6 months.  History of Present Illness:    Derek Wood is a 78 y.o. male who is being seen today for follow-up visit. PCP is Harvest Lineman, MD. Last visit with us  in the office was 12-26-2022 with Pattricia Bores, NP-C.  Pleasant man here for the visit ultimately by his wife.  Keeps himself busy with day-to-day activities and outdoor activities.   History of CAD initially noted when on cardiac CT coronary imaging subsequent cardiac cath May 2023 with severe left main and triple-vessel disease underwent CABG 05-15-2021 with LIMA-LAD, SVG-RPDA and SVG-OM1, with preserved LVEF 50 to 55% on IntraOp TEE May 2023, mild MR, moderate aortic insufficiency.  Paroxysmal atrial fibrillation during perioperative.,  GERD, dyslipidemia, right bundle branch block, left vertebral artery stenosis for which she had visit with vascular surgeon and underwent evaluation and will follow-up with them in 1 year, no significant abnormality.  On ultrasound carotids done recently in January 2025   No active cardiac symptoms.  Denies any chest pain, shortness of breath, orthopnea open paroxysmal nocturnal dyspnea. Trace bilateral ankle edema. No palpitations, lightheadedness, dizziness or syncopal episodes. Mechanical falls occasionally reported. Reports neck pain and joint pains and back pain which have been chronic.   Good compliance with all his medications.   Blood work from 03-02-2023 cholesterol 144, triglycerides 139, HDL 43, LDL 77. Improvement in comparison to prior direct LDL levels of 89 in December 2024.  Transaminases and alkaline phosphatase normal in February 2025. 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   S/P CABG x 4 05/15/2021    Past Surgical History:  Procedure Laterality Date   COLONOSCOPY  08/2016   CORONARY ARTERY BYPASS GRAFT N/A 05/15/2021   Procedure: CORONARY ARTERY BYPASS GRAFTING (CABG) TIMES THREE   USING LEFT INTERNAL MAMMARY ARTERY AND RIGHT AND LEFT SAPHENOUS VEIN;  Surgeon: Hilarie Lovely, MD;  Location: [facility] OR;  Service: Open Heart Surgery;  Laterality: N/A;   CYSTECTOMY  04/2021   Upper right quadrant of chest   LEFT HEART CATH AND CORONARY ANGIOGRAPHY N/A 05/08/2021   Procedure: LEFT HEART CATH AND CORONARY ANGIOGRAPHY;  Surgeon: Odie Benne, MD;  Location: [facility] INVASIVE CV LAB;  Service: Cardiovascular;  Laterality: N/A;   ROTATOR CUFF REPAIR Right 02/2015   TEE WITHOUT CARDIOVERSION N/A 05/15/2021   Procedure: TRANSESOPHAGEAL ECHOCARDIOGRAM (TEE);  Surgeon: Hilarie Lovely, MD;  Location: [facility] OR;  Service: Open Heart Surgery;  Laterality: N/A;    Current Medications: Current Meds  Medication Sig   acetaminophen  (TYLENOL ) 325 MG tablet Take 650 mg by mouth every 6 (six) hours as needed.   aspirin  EC 81 MG tablet Take 1 tablet (81 mg total) by mouth daily. Swallow whole.   Coenzyme Q10 100 MG capsule Take 100 mg by mouth daily.   Multiple Vitamin (ONE-DAILY MULTI-VITAMIN PO) Take 1 tablet by mouth daily.   omeprazole (PRILOSEC) 20 MG capsule Take 20 mg by mouth daily.   [DISCONTINUED] metoprolol  succinate (TOPROL -XL) 25 MG 24 hr tablet Take 0.5 tablets (12.5 mg total) by mouth daily. Patient must keep 05/25/2023 appointment for further refills.     Allergies:   Oxycodone  and Statins   Social History   Socioeconomic History   Marital status: Married    Spouse name: Not on file   Number of children: Not on file   Years of education: Not on file   Highest education level: Not on file  Occupational History   Not on file  Tobacco  Use   Smoking status: Never    Passive exposure: Never   Smokeless tobacco: Never  Vaping Use   Vaping status: Never Used  Substance and Sexual Activity   Alcohol use: Not Currently   Drug use: Never   Sexual activity: Not on file  Other Topics Concern   Not on file  Social History Narrative   Not on file   Social Drivers of Health   Financial Resource Strain: Not on file  Food Insecurity: Not on file  Transportation Needs: Not on file  Physical Activity: Not on file  Stress: Not on file  Social Connections: Not on file     Family History: The patient's family history includes Cancer in his mother; Diabetes in his brother; Heart attack in his father; Heart disease in his brother. ROS:   Please see the history of present illness.    All 14 point review of systems negative except as described per history of present illness.  EKGs/Labs/Other Studies Reviewed:    The following studies were reviewed today:   EKG:  Recent Labs: 12/26/2022: BUN 13; Creatinine, Ser 0.70; Hemoglobin 14.9; Platelets 157; Potassium 4.2; Sodium 142 03/02/2023: ALT 20  Recent Lipid Panel    Component Value Date/Time   CHOL 144 03/02/2023 0945   TRIG 139 03/02/2023 0945   HDL 43 03/02/2023 0945   CHOLHDL 3.3 03/02/2023 0945   LDLCALC 77 03/02/2023 0945   LDLDIRECT 89 12/26/2022 1057    Physical Exam:    VS:  BP 120/64   Pulse 82   Ht 5\' 9"  (1.753 m)   Wt 172 lb 12.8 oz (78.4 kg)   SpO2 96%   BMI 25.52 kg/m     Wt Readings from Last 3 Encounters:  05/25/23 172 lb 12.8 oz (78.4 kg)  02/09/23 176 lb (79.8 kg)  12/26/22 178 lb (80.7 kg)     GENERAL:  Well nourished, well developed in no acute distress NECK: No JVD; No carotid bruits CARDIAC: RRR, S1 and S2 present, no murmurs, no rubs, no gallops CHEST:  Clear to auscultation without rales, wheezing or rhonchi  Extremities: No pitting pedal edema. Pulses bilaterally symmetric with radial 2+ and dorsalis pedis 2+ NEUROLOGIC:   Alert and oriented x 3  Medication Adjustments/Labs and Tests Ordered: Current medicines are reviewed at length with the patient today.  Concerns regarding medicines are outlined above.  No orders of the defined types were placed in this encounter.  Meds ordered this encounter  Medications   metoprolol  succinate (TOPROL -XL) 25 MG 24 hr tablet    Sig: Take 0.5 tablets (12.5 mg total) by mouth daily. Patient must keep 05/25/2023 appointment for further refills.    Dispense:  90 tablet    Refill:  3    This prescription was filled on 01/21/2022. Any refills authorized will be placed on file.    Signed, Eilyn Polack reddy Deandrea Vanpelt, MD, MPH, [facility]. 05/25/2023 11:56 AM    [facility]

## 2023-05-25 NOTE — Patient Instructions (Addendum)
 Medication Instructions:  Your physician recommends that you continue on your current medications as directed. Please refer to the Current Medication list given to you today.  *If you need a refill on your cardiac medications before your next appointment, please call your pharmacy*  Lab Work: None If you have labs (blood work) drawn today and your tests are completely normal, you will receive your results only by: MyChart Message (if you have MyChart) OR A paper copy in the mail If you have any lab test that is abnormal or we need to change your treatment, we will call you to review the results.  Testing/Procedures: Your physician has requested that you have an echocardiogram. Echocardiography is a painless test that uses sound waves to create images of your heart. It provides your doctor with information about the size and shape of your heart and how well your heart's chambers and valves are working. This procedure takes approximately one hour. There are no restrictions for this procedure. Please do NOT wear cologne, perfume, aftershave, or lotions (deodorant is allowed). Please arrive 15 minutes prior to your appointment time.  Please note: We ask at that you not bring children with you during ultrasound (echo/ vascular) testing. Due to room size and safety concerns, children are not allowed in the ultrasound rooms during exams. Our front office staff cannot provide observation of children in our lobby area while testing is being conducted. An adult accompanying a patient to their appointment will only be allowed in the ultrasound room at the discretion of the ultrasound technician under special circumstances. We apologize for any inconvenience.   Follow-Up: At [facility], you and your health needs are our priority.  As part of our continuing mission to provide you with exceptional heart care, our providers are all part of one team.  This team includes your primary Cardiologist  (physician) and Advanced Practice Providers or APPs (Physician Assistants and Nurse Practitioners) who all work together to provide you with the care you need, when you need it.  Your next appointment:   6 month(s)  Provider:   Bertha Broad, MD    We recommend signing up for the patient portal called "MyChart".  Sign up information is provided on this After Visit Summary.  MyChart is used to connect with patients for Virtual Visits (Telemedicine).  Patients are able to view lab/test results, encounter notes, upcoming appointments, etc.  Non-urgent messages can be sent to your provider as well.   To learn more about what you can do with MyChart, go to [email or web address].   Other Instructions None

## 2023-06-11 DIAGNOSIS — G5602 Carpal tunnel syndrome, left upper limb: Secondary | ICD-10-CM | POA: Diagnosis not present

## 2023-06-18 ENCOUNTER — Telehealth: Payer: Self-pay

## 2023-06-18 NOTE — Telephone Encounter (Signed)
 Dr. Ronell Coe , patient's chart was reviewed for preoperative cardiac evaluation.  He was seen by you on 05/25/22 and according to protocol, we request that you comment on cardiac risk for upcoming procedure since office visit was less than 2 months ago.    Please route your response to p cv div preop.  Thank you, Gerldine Koch, NP-C 06/18/2023, 1:14 PM

## 2023-06-18 NOTE — Telephone Encounter (Signed)
   Pre-operative Risk Assessment    Patient Name: Derek Wood  DOB: 06-20-1945 MRN: [id number]   Date of last office visit: 05/25/23 Date of next office visit: N/A   Request for Surgical Clearance    Procedure:  LT CTR  Date of Surgery:  Clearance 07/03/23                                Surgeon:  Dr. Chancey Combe Surgeon's Group or Practice Name:  [facility] Phone number:  [phone] Fax number:  [phone]   Type of Clearance Requested:   - Medical    Type of Anesthesia:  Not Indicated   Additional requests/questions:    SignedBascom Lily   06/18/2023, 10:07 AM

## 2023-06-19 ENCOUNTER — Ambulatory Visit: Payer: Self-pay

## 2023-06-19 ENCOUNTER — Ambulatory Visit

## 2023-06-19 DIAGNOSIS — Z951 Presence of aortocoronary bypass graft: Secondary | ICD-10-CM

## 2023-06-19 DIAGNOSIS — I351 Nonrheumatic aortic (valve) insufficiency: Secondary | ICD-10-CM

## 2023-06-19 DIAGNOSIS — E782 Mixed hyperlipidemia: Secondary | ICD-10-CM

## 2023-06-19 DIAGNOSIS — I25118 Atherosclerotic heart disease of native coronary artery with other forms of angina pectoris: Secondary | ICD-10-CM | POA: Diagnosis not present

## 2023-06-19 LAB — ECHOCARDIOGRAM COMPLETE
AV Vena cont: 0.2 cm
Area-P 1/2: 3.03 cm2
P 1/2 time: 448 ms
S' Lateral: 3.4 cm

## 2023-06-19 NOTE — Telephone Encounter (Signed)
   Primary Cardiologist: Zoe Hinds, MD  Chart reviewed as part of pre-operative protocol coverage. Given past medical history and time since last visit, based on ACC/AHA guidelines, Derek Wood would be at acceptable risk for the planned procedure without further cardiovascular testing.   Patient was advised that if he develops new symptoms prior to surgery to contact our office to arrange a follow-up appointment.   I will route this recommendation to the requesting party via Epic fax function and remove from pre-op pool.  Please call with questions.  Gerldine Koch, NP-C  06/19/2023, 7:19 AM [address], [address] [place], [place] [address] Office [phone] Fax [phone]

## 2023-06-19 NOTE — Progress Notes (Signed)
 Please inform him test results show normal pumping function of the heart. There is mildly increased stiffness of the heart muscle.  There is mild leakiness associated with aortic valve which is not expected to cause any significant symptoms and appears less intense than what it did on previous imaging during your surgery. We will continue to monitor this with repeat echocardiogram in couple years.

## 2023-07-03 DIAGNOSIS — G5602 Carpal tunnel syndrome, left upper limb: Secondary | ICD-10-CM | POA: Diagnosis not present

## 2023-07-07 DIAGNOSIS — G5602 Carpal tunnel syndrome, left upper limb: Secondary | ICD-10-CM | POA: Diagnosis not present

## 2023-07-14 DIAGNOSIS — G5602 Carpal tunnel syndrome, left upper limb: Secondary | ICD-10-CM | POA: Diagnosis not present

## 2023-07-14 DIAGNOSIS — L03114 Cellulitis of left upper limb: Secondary | ICD-10-CM | POA: Diagnosis not present

## 2023-07-14 DIAGNOSIS — M65342 Trigger finger, left ring finger: Secondary | ICD-10-CM | POA: Diagnosis not present

## 2023-07-21 DIAGNOSIS — M65332 Trigger finger, left middle finger: Secondary | ICD-10-CM | POA: Diagnosis not present

## 2023-07-21 DIAGNOSIS — M5412 Radiculopathy, cervical region: Secondary | ICD-10-CM | POA: Diagnosis not present

## 2023-07-21 DIAGNOSIS — G5602 Carpal tunnel syndrome, left upper limb: Secondary | ICD-10-CM | POA: Diagnosis not present

## 2023-09-28 DIAGNOSIS — L570 Actinic keratosis: Secondary | ICD-10-CM | POA: Diagnosis not present

## 2023-09-28 DIAGNOSIS — D485 Neoplasm of uncertain behavior of skin: Secondary | ICD-10-CM | POA: Diagnosis not present

## 2023-10-06 DIAGNOSIS — C44319 Basal cell carcinoma of skin of other parts of face: Secondary | ICD-10-CM | POA: Diagnosis not present

## 2023-10-07 ENCOUNTER — Other Ambulatory Visit: Payer: Self-pay

## 2023-10-07 ENCOUNTER — Emergency Department (HOSPITAL_COMMUNITY)
Admission: EM | Admit: 2023-10-07 | Discharge: 2023-10-07 | Disposition: A | Discharge status: Home / Self Care | Attending: Emergency Medicine | Admitting: Emergency Medicine

## 2023-10-07 DIAGNOSIS — I2510 Atherosclerotic heart disease of native coronary artery without angina pectoris: Secondary | ICD-10-CM | POA: Diagnosis not present

## 2023-10-07 DIAGNOSIS — Z955 Presence of coronary angioplasty implant and graft: Secondary | ICD-10-CM | POA: Diagnosis not present

## 2023-10-07 DIAGNOSIS — R61 Generalized hyperhidrosis: Secondary | ICD-10-CM | POA: Diagnosis not present

## 2023-10-07 DIAGNOSIS — R1013 Epigastric pain: Secondary | ICD-10-CM | POA: Diagnosis not present

## 2023-10-07 DIAGNOSIS — K219 Gastro-esophageal reflux disease without esophagitis: Secondary | ICD-10-CM | POA: Insufficient documentation

## 2023-10-07 DIAGNOSIS — Z7982 Long term (current) use of aspirin: Secondary | ICD-10-CM | POA: Diagnosis not present

## 2023-10-07 DIAGNOSIS — R9431 Abnormal electrocardiogram [ECG] [EKG]: Secondary | ICD-10-CM | POA: Diagnosis not present

## 2023-10-07 LAB — CBC WITH DIFFERENTIAL/PLATELET
Abs Immature Granulocytes: 0.04 K/uL (ref 0.00–0.07)
Basophils Absolute: 0 K/uL (ref 0.0–0.1)
Basophils Relative: 0 %
Eosinophils Absolute: 0.3 K/uL (ref 0.0–0.5)
Eosinophils Relative: 3 %
HCT: 49.3 % (ref 39.0–52.0)
Hemoglobin: 17.1 g/dL — ABNORMAL HIGH (ref 13.0–17.0)
Immature Granulocytes: 0 %
Lymphocytes Relative: 15 %
Lymphs Abs: 1.7 K/uL (ref 0.7–4.0)
MCH: 30.3 pg (ref 26.0–34.0)
MCHC: 34.7 g/dL (ref 30.0–36.0)
MCV: 87.3 fL (ref 80.0–100.0)
Monocytes Absolute: 0.6 K/uL (ref 0.1–1.0)
Monocytes Relative: 5 %
Neutro Abs: 8.6 K/uL — ABNORMAL HIGH (ref 1.7–7.7)
Neutrophils Relative %: 77 %
Platelets: 156 K/uL (ref 150–400)
RBC: 5.65 MIL/uL (ref 4.22–5.81)
RDW: 11.9 % (ref 11.5–15.5)
WBC: 11.3 K/uL — ABNORMAL HIGH (ref 4.0–10.5)
nRBC: 0 % (ref 0.0–0.2)

## 2023-10-07 LAB — COMPREHENSIVE METABOLIC PANEL WITH GFR
ALT: 26 U/L (ref 0–44)
AST: 30 U/L (ref 15–41)
Albumin: 4.1 g/dL (ref 3.5–5.0)
Alkaline Phosphatase: 63 U/L (ref 38–126)
Anion gap: 8 (ref 5–15)
BUN: 18 mg/dL (ref 8–23)
CO2: 27 mmol/L (ref 22–32)
Calcium: 9.3 mg/dL (ref 8.9–10.3)
Chloride: 106 mmol/L (ref 98–111)
Creatinine, Ser: 0.91 mg/dL (ref 0.61–1.24)
GFR, Estimated: >60 mL/min (ref >60)
Glucose, Bld: 118 mg/dL — ABNORMAL HIGH (ref 70–99)
Potassium: 4.3 mmol/L (ref 3.5–5.1)
Sodium: 141 mmol/L (ref 135–145)
Total Bilirubin: 0.9 mg/dL (ref 0.0–1.2)
Total Protein: 7.4 g/dL (ref 6.5–8.1)

## 2023-10-07 LAB — TROPONIN I (HIGH SENSITIVITY)
Troponin I (High Sensitivity): 5 ng/L (ref <18)
Troponin I (High Sensitivity): 5 ng/L (ref <18)

## 2023-10-07 NOTE — ED Notes (Signed)
 Called lab to add troponin to previously sent blood

## 2023-10-07 NOTE — ED Triage Notes (Signed)
 Pt. Stated, I waa constipated and finally got up around 400 and used the bathroom so that took care of that problem. When I woke up at 600 I had excessive sweating all over so my wife wanted me to come in to get checked out.

## 2023-10-07 NOTE — ED Provider Notes (Signed)
 [place] EMERGENCY DEPARTMENT AT [facility] Provider Note   CSN: [id number] Arrival date & time: 10/07/23  9094     Patient presents with: Excessive Sweating   Derek Wood is a 78 y.o. male. HPI Patient is an 78 year old male presenting ED today for concerns for excessive sweating that started after straining for a bowel movement, noted to have had accompanying epigastric discomfort at the time.  Noted to have had a successful normal bowel movement with resolution of symptoms.  However noted to had lay down after straining and started to excessively sweat according to spouse who wished for him to be brought into the emergency department to be evaluated due to his heart history.  Noted to have an extensive cardiac history with 2 CABGs done.  Noted to have undergone dermatology biopsy to face for basal cell carcinoma yesterday.   Had not had anything to eat or drink today.  Denies fever, headache, blurry vision, vertigo, dizziness, lightheadedness, chest pain, shortness of breath, cough, congestion, diarrhea, melena, hematochezia, dysuria, hematuria, lower leg swelling.  Prior to Admission medications   Medication Sig Start Date End Date Taking? Authorizing Provider  acetaminophen  (TYLENOL ) 325 MG tablet Take 650 mg by mouth every 6 (six) hours as needed.    [provider]  aspirin  EC 81 MG tablet Take 1 tablet (81 mg total) by mouth daily. Swallow whole. 05/30/22   Monetta Redell PARAS, MD  Coenzyme Q10 100 MG capsule Take 100 mg by mouth daily.    [provider]  metoprolol  succinate (TOPROL -XL) 25 MG 24 hr tablet Take 0.5 tablets (12.5 mg total) by mouth daily. Patient must keep 05/25/2023 appointment for further refills. 05/25/23   Madireddy, Alean SAUNDERS, MD  Multiple Vitamin (ONE-DAILY MULTI-VITAMIN PO) Take 1 tablet by mouth daily.    [provider]  omeprazole (PRILOSEC) 20 MG capsule Take 20 mg by mouth daily.    [provider]   rosuvastatin  (CRESTOR ) 40 MG tablet Take 1 tablet (40 mg total) by mouth daily. 12/29/22 03/29/23  Carlin Delon BROCKS, NP    Allergies: Oxycodone  and Statins    Review of Systems  Constitutional:  Positive for diaphoresis.  Gastrointestinal:  Positive for abdominal pain.  All other systems reviewed and are negative.   Updated Vital Signs BP (!) 105/59   Pulse 79   Temp (!) 97.5 F (36.4 C)   Resp 17   Ht 5' 8 (1.727 m)   SpO2 99%   BMI 26.27 kg/m   Physical Exam Vitals and nursing note reviewed.  Constitutional:      General: He is not in acute distress.    Appearance: Normal appearance. He is not ill-appearing or diaphoretic.  HENT:     Head: Normocephalic.     Comments: Noted to have well-healing surgical site noted to right buccal area secondary to biopsy Eyes:     General: No scleral icterus.       Right eye: No discharge.        Left eye: No discharge.     Extraocular Movements: Extraocular movements intact.     Conjunctiva/sclera: Conjunctivae normal.  Cardiovascular:     Rate and Rhythm: Normal rate and regular rhythm.     Pulses: Normal pulses.     Heart sounds: Normal heart sounds. No murmur heard.    No friction rub. No gallop.  Pulmonary:     Effort: Pulmonary effort is normal. No respiratory distress.     Breath sounds: No stridor.  No wheezing, rhonchi or rales.  Chest:     Chest wall: No tenderness.  Abdominal:     General: Abdomen is flat. There is no distension.     Palpations: Abdomen is soft.     Tenderness: There is no abdominal tenderness. There is no right CVA tenderness, left CVA tenderness, guarding or rebound.  Musculoskeletal:        General: No swelling, deformity or signs of injury.     Cervical back: Normal range of motion. No rigidity.     Right lower leg: No edema.     Left lower leg: No edema.  Skin:    General: Skin is warm and dry.     Findings: No bruising, erythema or lesion.  Neurological:     General: No focal deficit  present.     Mental Status: He is alert and oriented to person, place, and time. Mental status is at baseline.     Sensory: No sensory deficit.     Motor: No weakness.  Psychiatric:        Mood and Affect: Mood normal.     (all labs ordered are listed, but only abnormal results are displayed) Labs Reviewed  CBC WITH DIFFERENTIAL/PLATELET - Abnormal; Notable for the following components:      Result Value   WBC 11.3 (*)    Hemoglobin 17.1 (*)    Neutro Abs 8.6 (*)    All other components within normal limits  COMPREHENSIVE METABOLIC PANEL WITH GFR - Abnormal; Notable for the following components:   Glucose, Bld 118 (*)    All other components within normal limits  TROPONIN I (HIGH SENSITIVITY)  TROPONIN I (HIGH SENSITIVITY)    EKG: EKG Interpretation Date/Time:  Wednesday October 07 2023 09:32:23 EDT Ventricular Rate:  90 PR Interval:  162 QRS Duration:  102 QT Interval:  374 QTC Calculation: 457 R Axis:   98  Text Interpretation: Normal sinus rhythm Incomplete right bundle branch block Right ventricular hypertrophy with repolarization abnormality Nonspecific T wave abnormality Abnormal ECG When compared with ECG of 26-Dec-2022 10:05, PREVIOUS ECG IS PRESENT no sig chnage from previous Confirmed by Armenta Canning [phone]) on 10/07/2023 1:02:45 PM  Radiology: No results found.  Procedures   Medications Ordered in the ED - No data to display  Medical Decision Making Amount and/or Complexity of Data Reviewed Labs: ordered.   This patient is a 78 year old male  who presents to the ED for concern of excessive diaphoresis after straining during bowel movement that was successful and normal.  Came to the ED today secondary to spouse wishing for him to be evaluated due to his extensive cardiac history.  Currently reporting that he is back to normal.  On physical exam, patient is in no acute distress, afebrile, alert and orient x 4, speaking in full sentences, nontachypneic,  nontachycardic. LCTAB, RRR, no murmurs.  No lower leg edema.  Abdomen is nontender, no CVA tenderness.  Unremarkable exam otherwise.  With patient's well-appearing presentation and resolution of symptoms, will check troponins due to patient's extensive cardiac history.  Mildly elevated white count likely due to recent biopsy versus hemoconcentration with noting borderline elevated RBC as well as elevated hemoglobin.  Will have him continue to hydrate and follow-up with PCP for further management.  Troponins flat.  Low suspicion for ACS or any emergent causes of patient's diaphoresis.  Suspecting likely secondary to straining for bowel movement.  Will have him continue follow-up with PCP as well as cardiology.  Provided  return ER precautions.  Patient vital signs have remained stable throughout the course of patient's time in the ED. Low suspicion for any other emergent pathology at this time. I believe this patient is safe to be discharged. Provided strict return to ER precautions. Patient expressed agreement and understanding of plan. All questions were answered.   Differential diagnoses prior to evaluation: The emergent differential diagnosis includes, but is not limited to,  PUD, gastritis, pancreatitis, gastroparesis, malignancy, biliary disease, ACS, PE, pericarditis, pneumonia, intestinal ischemia, esophageal rupture, hepatitis,  . This is not an exhaustive differential.   Past Medical History / Co-morbidities / Social History: Status post CABG x 4, right bundle branch block, aortic regurgitation, CAD, HLD, headache, GERD  Additional history: Chart reviewed. Pertinent results include:   Last echo done on 06/19/2023 noted to have an EF of 60 to 65% with normal LV function Last heart catheterization done on 05/08/2021 noting proximal severe left main and triple-vessel CAD undergoing triple vessel CABG after on 05/15/2021.  Last seen by cardiology on 12/26/2022, Noted to have carotid artery  stenosis at that time.   Lab Tests/Imaging studies: I personally interpreted labs/imaging and the pertinent results include:    CBC notes mildly elevated white count of 11.3 and elevated hemoglobin of 17.1 likely secondary to hyper concentration with patient not having anything to eat or drink versus recent biopsy.. CMP unremarkable Troponins flat  Cardiac monitoring: EKG obtained and interpreted by myself and attending physician which shows: NSR   Medications: I have reviewed the patients home medicines and have made adjustments as needed.  Critical Interventions: None  Social Determinants of Health: None  Disposition: After consideration of the diagnostic results and the patients response to treatment, I feel that the patient would benefit from discharge and treatment as above.   emergency department workup does not suggest an emergent condition requiring admission or immediate intervention beyond what has been performed at this time. The plan is: Follow-up with PCP and cardiology, return for new or worsening symptoms. The patient is safe for discharge and has been instructed to return immediately for worsening symptoms, change in symptoms or any other concerns.  Final diagnoses:  Hyperhidrosis    ED Discharge Orders     None          Beola Terrall RAMAN, [place] 10/07/23 1304    Armenta Canning, MD 10/08/23 1615

## 2023-10-07 NOTE — Discharge Instructions (Addendum)
 You are seen today for excessive sweating after a bowel movement earlier today.  Your lab work today did note a mildly elevated white count which I recommend you continue to follow-up with your PCP to have it rechecked in a week.  This appears to be likely secondary to dehydration.  Your cardiac labs were very reassuring.  Will recommend you continue to follow-up with cardiology for continued management and monitoring.  Return to the ED for any new or worsening symptoms.

## 2023-11-23 ENCOUNTER — Ambulatory Visit

## 2023-11-23 VITALS — BP 100/62 | HR 90 | Ht 70.0 in | Wt 172.4 lb

## 2023-11-23 DIAGNOSIS — I351 Nonrheumatic aortic (valve) insufficiency: Secondary | ICD-10-CM

## 2023-11-23 DIAGNOSIS — E782 Mixed hyperlipidemia: Secondary | ICD-10-CM

## 2023-11-23 DIAGNOSIS — Z951 Presence of aortocoronary bypass graft: Secondary | ICD-10-CM | POA: Diagnosis not present

## 2023-11-23 DIAGNOSIS — I25118 Atherosclerotic heart disease of native coronary artery with other forms of angina pectoris: Secondary | ICD-10-CM | POA: Diagnosis not present

## 2023-11-23 NOTE — Progress Notes (Signed)
 Cardiology Consultation:    Date:  11/23/2023   ID:  Derek Wood, DOB 06-Apr-1945, MRN [id number]  PCP:  Derek Nest, MD  Cardiologist:  Derek SAUNDERS Elisha Mcgruder, MD   Referring MD: Derek Nest, MD   No chief complaint on file.    ASSESSMENT AND PLAN:   [name] 78 year old male with history of CAD initially noted when on cardiac CT coronary imaging subsequent cardiac cath May 2023 with severe left main and triple-vessel disease underwent CABG 05-15-2021 with LIMA-LAD, SVG-RPDA and SVG-OM1, with preserved LVEF 50 to 55% on IntraOp TEE May 2023, mild MR, moderate aortic insufficiency.  Brief atrial fibrillation during perioperative phase,  GERD, dyslipidemia, right bundle branch block, left vertebral artery stenosis follow up with vascular surgeon. Repeat echocardiogram 06/19/2023 noted LVEF 60 to 65% grade 2 diastolic dysfunction, aortic valve insufficiency appeared mild  Here for follow-up visit Problem List Items Addressed This Visit     Mixed hyperlipidemia   Lipid panel from February 2025 reviewed. Has follow-up visit coming up with PCP will defer to PCP for annual follow-up lipid panel. Continue Crestor  40 mg once daily.      Coronary artery disease of native artery of native heart with stable angina pectoris - Primary   Asymptomatic. Functional status somewhat limited due to deconditioning.  No symptoms with day-to-day activities at home.  Has not been exercising.  Continue aspirin  81 mg once daily Continue rosuvastatin  40 mg once daily.  In view of ongoing lightheadedness dizziness symptoms and low blood pressures, discontinue metoprolol  succinate.  Was on a low-dose 12.5 mg once daily, no need to taper.      Aortic regurgitation   Aortic insufficiency appears mild on repeat echocardiogram from June 2025. Reassuring results.       S/P CABG x 3   Return to clinic tentatively in 6 months.   History of Present Illness:    Derek Wood is a 78 y.o.  male who is being seen today for follow-up visit. PCP is Derek Nest, MD. Last visit with me in the office was 05/25/2023. Last visit with vascular surgeon Dr. Serene was 02/09/2023 for vertebral artery stenosis follow-up.  Has a history of CAD initially noted when on cardiac CT coronary imaging subsequent cardiac cath May 2023 with severe left main and triple-vessel disease underwent CABG 05-15-2021 with LIMA-LAD, SVG-RPDA and SVG-OM1, with preserved LVEF 50 to 55% on IntraOp TEE May 2023, mild MR, moderate aortic insufficiency.  Brief atrial fibrillation during perioperative phase,  GERD, dyslipidemia, right bundle branch block, left vertebral artery stenosis follow up with vascular surgeon. Repeat echocardiogram 06/19/2023 noted LVEF 60 to 65% grade 2 diastolic dysfunction, aortic valve insufficiency appeared mild  He had a visit 10/07/2023 to the ER for epigastric discomfort, constipation followed by profuse sweating.  High-sensitivity troponins were unremarkable.  EKG in the ER noted sinus rhythm with RBBB morphology.  This was a day after skin lesion excision from under the right jaw that turned out to be basal cell cancer.  Pleasant man here for the visit today accompanied by his wife. She helps manage his medications.  She does report he is off balance and dizzy at times with ambulation.  Denies any palpitations.  No syncopal or near syncopal episodes.  Did sustain a fall walking to his bathroom in the dark tripping over weighing scale. Overall from cardiac standpoint he does not have any symptoms of chest pain, shortness of breath, orthopnea or paroxysmal nocturnal dyspnea no pedal edema.  No palpitations.  Not routinely exercising.  Using a ride on lawnmower at times.  Good compliance with his medications. Blood pressures typically run low.  Last lipid panel reviewed from 03/02/2023 total cholesterol 144, triglycerides 139, HDL 43, LDL 77. Recent complete metabolic panel from  10/07/2023 notes sodium 141, potassium 4.3 BUN 18, creatinine 0.91, normal transaminases and alkaline phosphatase CBC with mild elevated WBC 11.3, hemoglobin elevated 17.1, hematocrit 49.3, platelets 156.  Past Medical History:  Diagnosis Date   Aortic regurgitation 05/15/2021   Coronary artery disease of native artery of native heart with stable angina pectoris    GERD (gastroesophageal reflux disease)    Grade I diastolic dysfunction 05/15/2021   Headache    History of right bundle branch block (RBBB) 05/15/2021   Mixed hyperlipidemia    S/P CABG x 3 05/15/2021   S/P CABG x 4 05/15/2021    Past Surgical History:  Procedure Laterality Date   COLONOSCOPY  08/2016   CORONARY ARTERY BYPASS GRAFT N/A 05/15/2021   Procedure: CORONARY ARTERY BYPASS GRAFTING (CABG) TIMES THREE   USING LEFT INTERNAL MAMMARY ARTERY AND RIGHT AND LEFT SAPHENOUS VEIN;  Surgeon: Shyrl Linnie KIDD, MD;  Location: [facility] OR;  Service: Open Heart Surgery;  Laterality: N/A;   CYSTECTOMY  04/2021   Upper right quadrant of chest   LEFT HEART CATH AND CORONARY ANGIOGRAPHY N/A 05/08/2021   Procedure: LEFT HEART CATH AND CORONARY ANGIOGRAPHY;  Surgeon: Verlin Lonni BIRCH, MD;  Location: [facility] INVASIVE CV LAB;  Service: Cardiovascular;  Laterality: N/A;   ROTATOR CUFF REPAIR Right 02/2015   TEE WITHOUT CARDIOVERSION N/A 05/15/2021   Procedure: TRANSESOPHAGEAL ECHOCARDIOGRAM (TEE);  Surgeon: Shyrl Linnie KIDD, MD;  Location: [facility] [place] OR;  Service: Open Heart Surgery;  Laterality: N/A;    Current Medications: Current Meds  Medication Sig   acetaminophen  (TYLENOL ) 325 MG tablet Take 650 mg by mouth every 6 (six) hours as needed.   aspirin  EC 81 MG tablet Take 1 tablet (81 mg total) by mouth daily. Swallow whole.   fluticasone (FLONASE) 50 MCG/ACT nasal spray Place 1 spray into both nostrils as needed for allergies or rhinitis.   Multiple Vitamin (ONE-DAILY MULTI-VITAMIN PO) Take 1 tablet by mouth daily.   Nutritional Supplements  (COLD AND FLU PO) Take 1 tablet by mouth as needed (sinus congestion).   omeprazole (PRILOSEC) 20 MG capsule Take 20 mg by mouth daily.   promethazine -dextromethorphan (PROMETHAZINE -DM) 6.25-15 MG/5ML syrup Take 1.25 mLs by mouth as needed for cough.   rosuvastatin  (CRESTOR ) 40 MG tablet Take 1 tablet (40 mg total) by mouth daily.   [DISCONTINUED] metoprolol  succinate (TOPROL -XL) 25 MG 24 hr tablet Take 0.5 tablets (12.5 mg total) by mouth daily. Patient must keep 05/25/2023 appointment for further refills.     Allergies:   Oxycodone  and Statins   Social History   Socioeconomic History   Marital status: Married    Spouse name: Not on file   Number of children: Not on file   Years of education: Not on file   Highest education level: Not on file  Occupational History   Not on file  Tobacco Use   Smoking status: Never    Passive exposure: Never   Smokeless tobacco: Never  Vaping Use   Vaping status: Never Used  Substance and Sexual Activity   Alcohol use: Not Currently   Drug use: Never   Sexual activity: Not on file  Other Topics Concern   Not on file  Social History Narrative   Not on file  Social Drivers of Corporate Investment Banker Strain: Not on file  Food Insecurity: Not on file  Transportation Needs: Not on file  Physical Activity: Not on file  Stress: Not on file  Social Connections: Not on file     Family History: The patient's family history includes Cancer in his mother; Diabetes in his brother; Heart attack in his father; Heart disease in his brother. ROS:   Please see the history of present illness.    All 14 point review of systems negative except as described per history of present illness.  EKGs/Labs/Other Studies Reviewed:    The following studies were reviewed today:   EKG:       Recent Labs: 10/07/2023: ALT 26; BUN 18; Creatinine, Ser 0.91; Hemoglobin 17.1; Platelets 156; Potassium 4.3; Sodium 141  Recent Lipid Panel    Component Value  Date/Time   CHOL 144 03/02/2023 0945   TRIG 139 03/02/2023 0945   HDL 43 03/02/2023 0945   CHOLHDL 3.3 03/02/2023 0945   LDLCALC 77 03/02/2023 0945   LDLDIRECT 89 12/26/2022 1057    Physical Exam:    VS:  BP 100/62   Pulse 90   Ht 5' 10 (1.778 m)   Wt 172 lb 6.4 oz (78.2 kg)   SpO2 96%   BMI 24.74 kg/m     Wt Readings from Last 3 Encounters:  11/23/23 172 lb 6.4 oz (78.2 kg)  05/25/23 172 lb 12.8 oz (78.4 kg)  02/09/23 176 lb (79.8 kg)     GENERAL:  Well nourished, well developed in no acute distress NECK: No JVD; No carotid bruits CARDIAC: RRR, S1 and S2 present, no murmurs, no rubs, no gallops CHEST:  Clear to auscultation without rales, wheezing or rhonchi  Extremities: No pitting pedal edema. Pulses bilaterally symmetric with radial 2+ and dorsalis pedis 2+ NEUROLOGIC:  Alert and oriented x 3  Medication Adjustments/Labs and Tests Ordered: Current medicines are reviewed at length with the patient today.  Concerns regarding medicines are outlined above.  No orders of the defined types were placed in this encounter.  No orders of the defined types were placed in this encounter.   Signed, Derek jess Kobus, MD, MPH, [facility]. 11/23/2023 9:23 AM    [facility]

## 2023-11-23 NOTE — Patient Instructions (Signed)
 Medication Instructions:  Your physician recommends that you discontinue taking the following medication:  Metoprolol  succinate   *If you need a refill on your cardiac medications before your next appointment, please call your pharmacy*   Lab Work: None ordered If you have labs (blood work) drawn today and your tests are completely normal, you will receive your results only by: MyChart Message (if you have MyChart) OR A paper copy in the mail If you have any lab test that is abnormal or we need to change your treatment, we will call you to review the results.   Testing/Procedures: None ordered   Follow-Up: At [facility], you and your health needs are our priority.  As part of our continuing mission to provide you with exceptional heart care, we have created designated Provider Care Teams.  These Care Teams include your primary Cardiologist (physician) and Advanced Practice Providers (APPs -  Physician Assistants and Nurse Practitioners) who all work together to provide you with the care you need, when you need it.  We recommend signing up for the patient portal called MyChart.  Sign up information is provided on this After Visit Summary.  MyChart is used to connect with patients for Virtual Visits (Telemedicine).  Patients are able to view lab/test results, encounter notes, upcoming appointments, etc.  Non-urgent messages can be sent to your provider as well.   To learn more about what you can do with MyChart, go to [email or web address].    Your next appointment:   6 month(s)  The format for your next appointment:   In Person  Provider:   Alean Kobus, MD    Other Instructions none  Important Information About Sugar

## 2023-11-23 NOTE — Assessment & Plan Note (Signed)
 Lipid panel from February 2025 reviewed. Has follow-up visit coming up with PCP will defer to PCP for annual follow-up lipid panel. Continue Crestor  40 mg once daily.

## 2023-11-23 NOTE — Assessment & Plan Note (Signed)
 Asymptomatic. Functional status somewhat limited due to deconditioning.  No symptoms with day-to-day activities at home.  Has not been exercising.  Continue aspirin  81 mg once daily Continue rosuvastatin  40 mg once daily.  In view of ongoing lightheadedness dizziness symptoms and low blood pressures, discontinue metoprolol  succinate.  Was on a low-dose 12.5 mg once daily, no need to taper.

## 2023-11-23 NOTE — Assessment & Plan Note (Signed)
 Aortic insufficiency appears mild on repeat echocardiogram from June 2025. Reassuring results.

## 2023-12-10 IMAGING — DX DG CHEST 1V PORT
1 series · 1 of 1 positions shown · non-contrast
Comparison: Chest x-ray dated May 16, 2021

CLINICAL DATA: Pneumothorax

EXAM:
PORTABLE CHEST 1 VIEW

[chest ap]
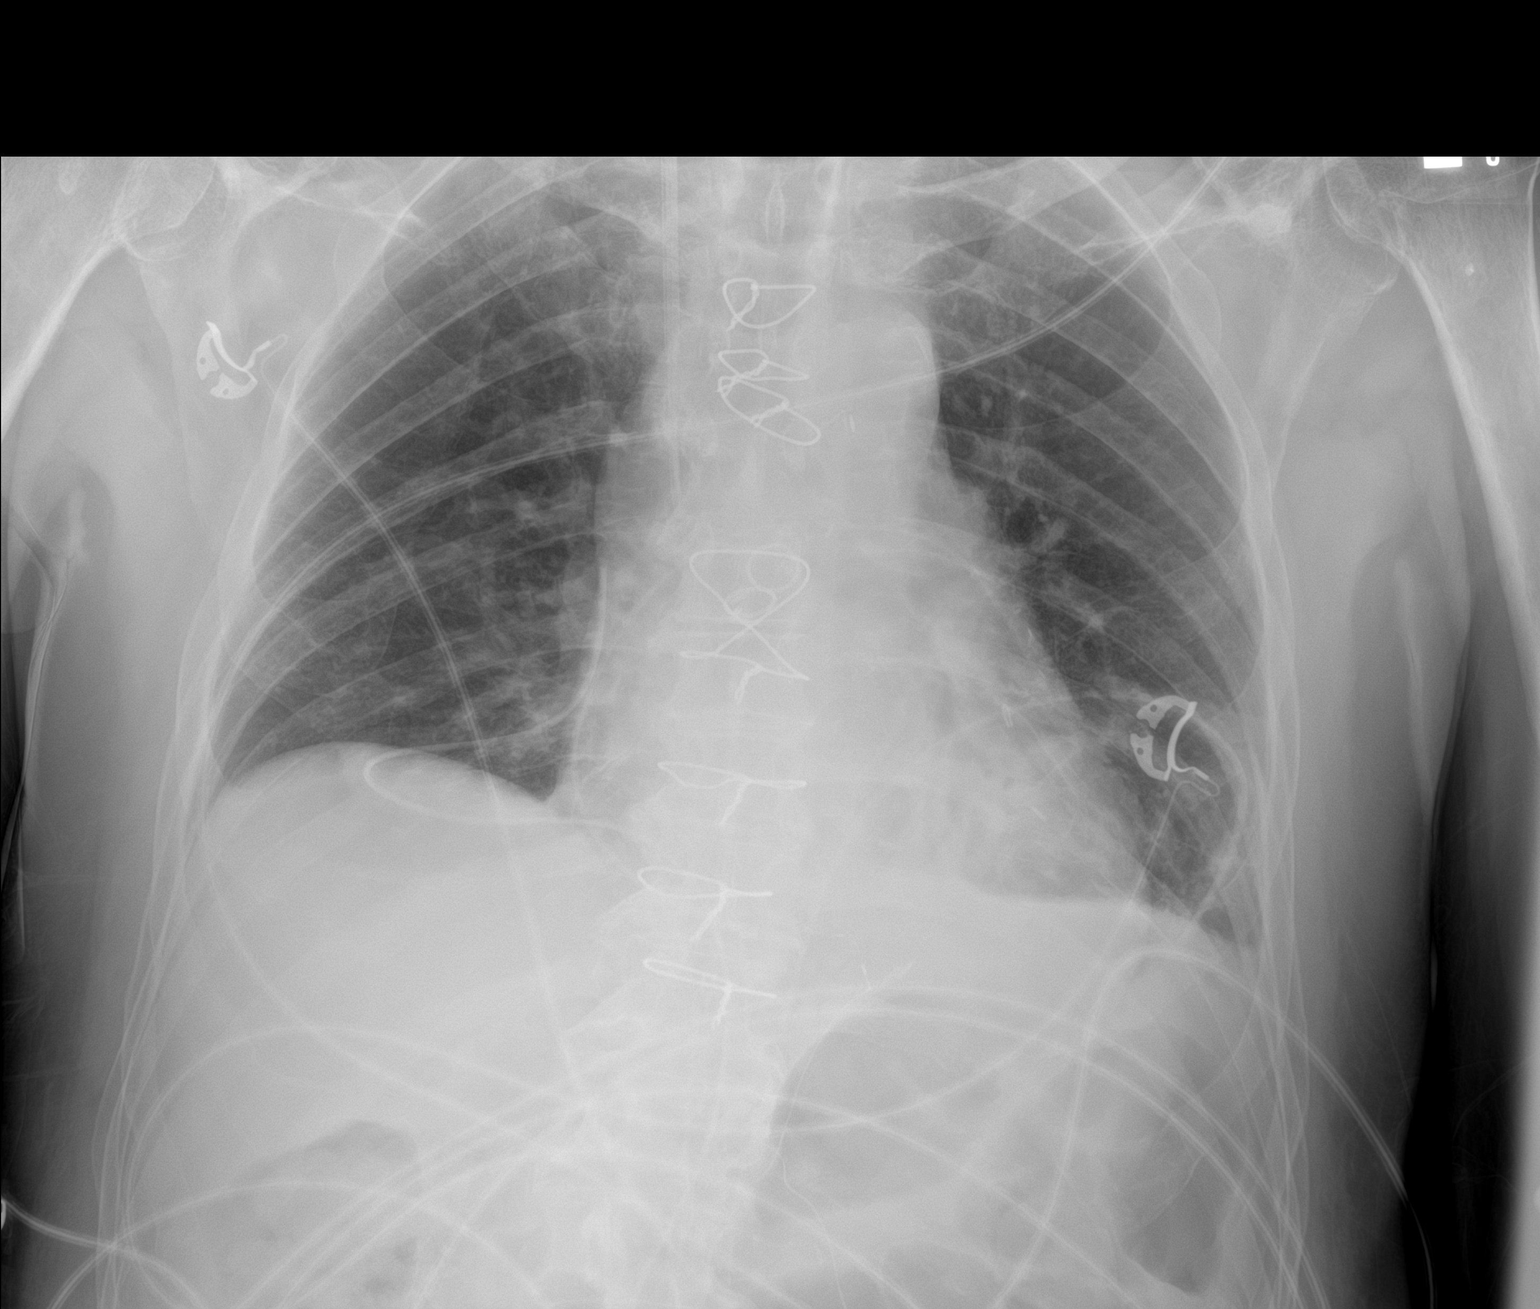

[1 of 1 positions shown; findings below may reference images not displayed]

FINDINGS: Cardiac and mediastinal contours are unchanged post median
sternotomy and CABG. Unchanged position of right IJ line and
bilateral chest tubes. Mild left basilar opacities, likely due to
atelectasis. No large pleural effusion or of pneumothorax.
IMPRESSION: 1. Stable support devices.
2. Stable left basilar atelectasis.

## 2023-12-21 ENCOUNTER — Other Ambulatory Visit: Payer: Self-pay

## 2023-12-21 MED ORDER — ROSUVASTATIN CALCIUM 40 MG PO TABS: 40.0000 mg | ORAL_TABLET | Freq: Every day | ORAL | 2 refills | Status: AC

## 2024-01-18 IMAGING — DX DG CHEST 2V
2 series · 2 of 2 positions shown · non-contrast
Comparison: Chest x-ray dated May 19, 2021.

CLINICAL DATA: Recent CABG.  No chest complaints.

EXAM:
CHEST - 2 VIEW

[dg chest 2 view (1 of 2)]
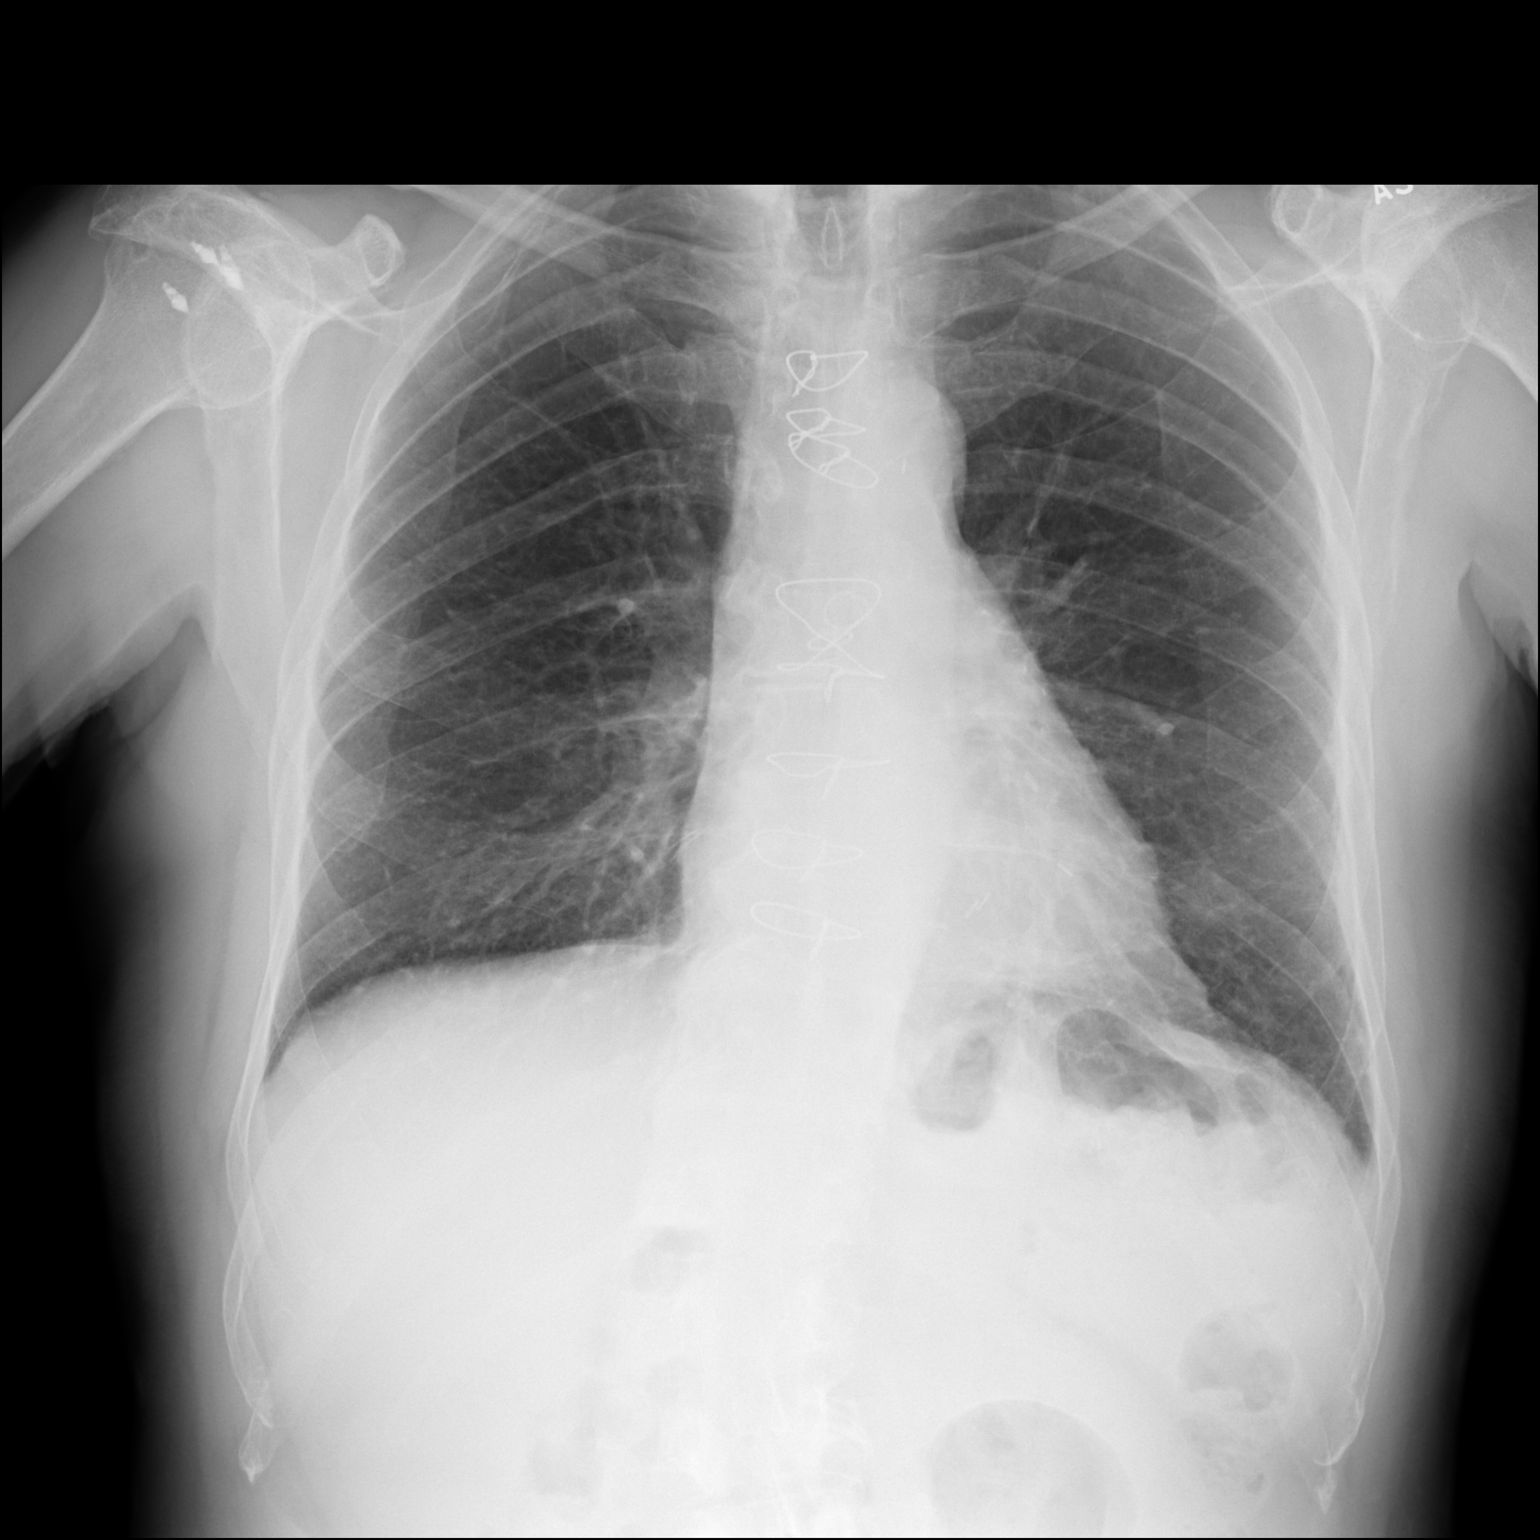

[dg chest 2 view (2 of 2)]
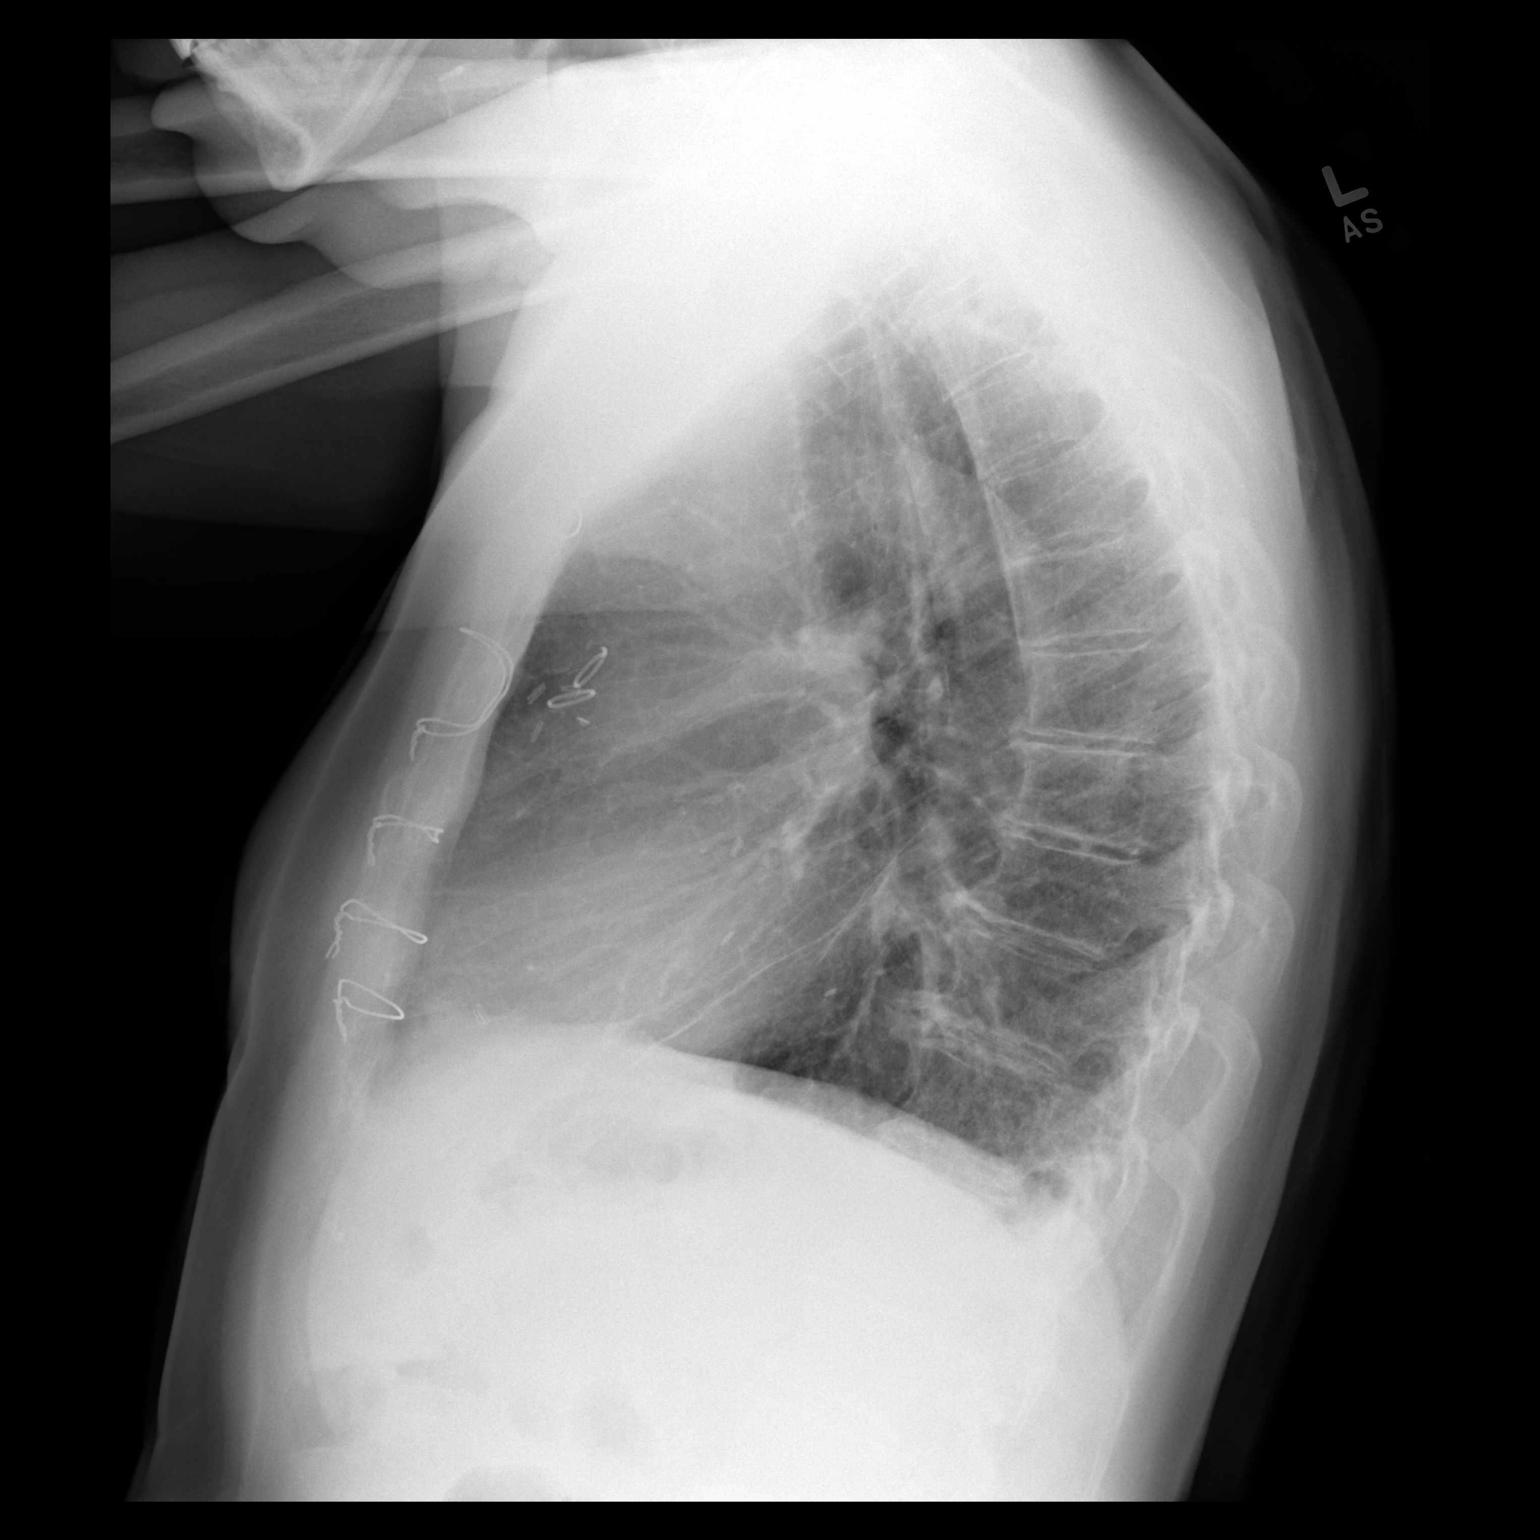

[2 of 2 positions shown; findings below may reference images not displayed]

FINDINGS: Normal heart size status post CABG. Normal pulmonary vascularity. No
focal consolidation, pleural effusion, or pneumothorax. No acute
osseous abnormality.
IMPRESSION: No active cardiopulmonary disease.

## 2024-01-26 ENCOUNTER — Encounter: Payer: Self-pay | Admitting: Physician Assistant

## 2024-02-11 ENCOUNTER — Other Ambulatory Visit

## 2024-02-11 ENCOUNTER — Encounter: Payer: Self-pay | Admitting: Physician Assistant

## 2024-02-11 ENCOUNTER — Ambulatory Visit: Admitting: Physician Assistant

## 2024-02-11 ENCOUNTER — Ambulatory Visit

## 2024-02-11 VITALS — BP 123/61 | HR 84 | Ht 69.0 in | Wt 173.6 lb

## 2024-02-11 DIAGNOSIS — R413 Other amnesia: Secondary | ICD-10-CM | POA: Diagnosis not present

## 2024-02-11 NOTE — Progress Notes (Cosign Needed)
 "       Memory Impairment   Derek Wood is a very pleasant 79 y.o. year old RH male with a history of hypertension, hyperlipidemia, CAD status post CABG, left vertebral artery stenosis, CHF seen today for evaluation of memory loss. MoCA today is 13/30 . 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 Patient is able to participate on ADLs and to drive. Mood is stable . Patient is accompanied by his daughter and his wife who supplement the history.  Follow up in 3 months   MRI brain to further evaluate for structural abnormalities and vascular load  Check B12, TSH Will entertain [facility] after MRI results are available Monitor driving   Discussed the use of AI scribe software for clinical note transcription with the patient, who gave verbal consent to proceed.  History of Present Illness      Memory impairment has been present for approximately two years, with notable progression over the past five to six months. Both short-term and long-term memory are affected, including difficulty recalling new information, recent conversations, and names. He has increasing trouble operating familiar devices such as his generator, phone, and remote control. He occasionally forgets names of long-standing acquaintances and dates. He often requires reminders to take medications and, according to his wife, sometimes asks the same questions, which has become more frequent over the last six months.  He does not experience disorientation in his home or neighborhood, does not wander off.  He does not misplace objects in unusual locations and has not failed to recognize familiar people or places. He continues to drive  though his wife recalls one recent episode of confusion regarding a grocery store location despite having been there several times.  Over the past six months, personality and mood changes have emerged, including increased sensitivity to loud environments and a tendency to be quieter  and less conversational, particularly around family. He prefers to listen rather than participate in conversations.  Sleep duration has increased for more than six months. There are no frequent nightmares, abnormal movements during sleep, or symptoms suggestive of sleep apnea. Over the past year or more, he has shown a preference for washing off rather than showering.  He transferred management of finances to his daughter approximately eight months ago due to forgetting to pay bills or paying them multiple times. His wife fills his weekly pill organizer and provides medication reminders.  Appetite is preserved, with adequate intake of a variety of foods and fluids. There are no issues with swallowing or choking. He ambulates independently without assistive devices and has not experienced recent falls or major head injuries. He denies headaches, chronic pain, or need for pain medications.  He reports history of intermittent trigger finger affecting both hands, having seen a hand surgeon.    There is no history of vision loss, glaucoma, cataracts, macular degeneration, stroke, or tremor aside from intermittent trigger finger. He denies loss of sense of smell. He does not use alcohol or tobacco. There is a family history of dementia in extended relatives, but not in first-degree relatives.  He has a history of chronic constipation but denies urinary incontinence or significant nocturia.  He has engineer, structural, retired copywriter, advertising.     Allergies[1]  Current Outpatient Medications  Medication Instructions   acetaminophen  (TYLENOL ) 650 mg, Every 6 hours PRN   aspirin  EC 81 mg, Oral, Daily, Swallow whole.   fluticasone (FLONASE) 50 MCG/ACT nasal spray 1 spray, As needed   Multiple Vitamin (  ONE-DAILY MULTI-VITAMIN PO) 1 tablet, Daily   Nutritional Supplements (COLD AND FLU PO) 1 tablet, As needed   omeprazole (PRILOSEC) 20 mg, Daily   promethazine -dextromethorphan (PROMETHAZINE -DM)  6.25-15 MG/5ML syrup 1.25 mLs, As needed   rosuvastatin  (CRESTOR ) 40 mg, Oral, Daily    VITALS:   Vitals:   02/11/24 0728  BP: 123/61  Pulse: 84  SpO2: 98%  Weight: 173 lb 9.6 oz (78.7 kg)  Height: 5' 9 (1.753 m)     Neurological Exam     02/11/2024    8:00 AM  Montreal Cognitive Assessment   Visuospatial/ Executive (0/5) 1  Naming (0/3) 2  Attention: Read list of digits (0/2) 1  Attention: Read list of letters (0/1) 1  Attention: Serial 7 subtraction starting at 100 (0/3) 1  Language: Repeat phrase (0/2) 0  Language : Fluency (0/1) 0  Abstraction (0/2) 2  Delayed Recall (0/5) 4  Orientation (0/6) 1  Total 13  Adjusted Score (based on education) 13        No data to display            Orientation:  Alert and oriented to person, not to place or time. No aphasia or dysarthria. Fund of knowledge is reduced. Recent and remote memory impaired.  Attention and concentration are reduced.  Able to name objects and unable to repeat phrases.  Delayed recall 4/5 Cranial nerves: There is good facial symmetry. Extraocular muscles are intact and visual fields are full to confrontational testing. Speech is fluent and clear. No tongue deviation. Hearing is intact to conversational tone.  Tone: Tone is good throughout. Sensation: Sensation is intact to light touch.  Vibration is intact at the bilateral big toe.  Coordination: The patient has no difficulty with RAM's or FNF bilaterally. Normal finger to nose  Motor: Strength is 5/5 in the bilateral upper and lower extremities. There is no pronator drift. There are no fasciculations noted. DTR's: Deep tendon reflexes are 2/4 bilaterally. Gait and Station: The patient is able to ambulate without difficulty. Gait is cautious and narrow. Stride length is normal.        Thank you for allowing us  the opportunity to participate in the care of this nice patient. Please do not hesitate to contact us  for any questions or concerns.   Total time  spent on today's visit was 61 minutes dedicated to this patient today, preparing to see patient, examining the patient, ordering tests and/or medications and counseling the patient, documenting clinical information in the EHR or other health record, independently interpreting results and communicating results to the patient/family, discussing treatment and goals, answering patient's questions and coordinating care.  Cc:  Clemmie Nest, MD  Camie Sevin 02/11/2024 8:49 AM       [1]  Allergies Allergen Reactions   Oxycodone      Other reaction(s): Other (See Comments) made me feel funny   Statins Other (See Comments)    Myalgia   "

## 2024-02-11 NOTE — Patient Instructions (Addendum)
 It was a pleasure to see you today at our office.   Recommendations:   MRI of the brain, the radiology office will call you to arrange you appointment  [phone] Check labs today  [address] Follow up in 3 months Recommend visiting the website :  Dementia Success Path to better understand some behaviors related to memory loss.  For psychiatric meds, mood meds: Please have your primary care physician manage these medications.    [email or web address]   RECOMMENDATIONS FOR ALL PATIENTS WITH MEMORY PROBLEMS: 1. Continue to exercise (Recommend 30 minutes of walking everyday, or 3 hours every week) 2. Increase social interactions - continue going to [place] and enjoy social gatherings with friends and family 3. Eat healthy, avoid fried foods and eat more fruits and vegetables 4. Maintain adequate blood pressure, blood sugar, and blood cholesterol level. Reducing the risk of stroke and cardiovascular disease also helps promoting better memory. 5. Avoid stressful situations. Live a simple life and avoid aggravations. Organize your time and prepare for the next day in anticipation. 6. Sleep well, avoid any interruptions of sleep and avoid any distractions in the bedroom that may interfere with adequate sleep quality 7. Avoid sugar, avoid sweets as there is a strong link between excessive sugar intake, diabetes, and cognitive impairment We discussed the Mediterranean diet, which has been shown to help patients reduce the risk of progressive memory disorders and reduces cardiovascular risk. This includes eating fish, eat fruits and green leafy vegetables, nuts like almonds and hazelnuts, walnuts, and also use olive oil. Avoid fast foods and fried foods as much as possible. Avoid sweets and sugar as sugar use has been linked to worsening of memory function.  There is always a concern of gradual progression of memory problems. If this is the case,  then we may need to adjust level of care according to patient needs. Support, both to the patient and caregiver, should then be put into place.      You have been referred for a neuropsychological evaluation (i.e., evaluation of memory and thinking abilities). Please bring someone with you to this appointment if possible, as it is helpful for the doctor to hear from both you and another adult who knows you well. Please bring eyeglasses and hearing aids if you wear them.    The evaluation will take approximately 3 hours and has two parts:   The first part is a clinical interview with the neuropsychologist (Dr. Richie or Dr. Gayland). During the interview, the neuropsychologist will speak with you and the individual you brought to the appointment.    The second part of the evaluation is testing with the doctor's technician Neal or Luke). During the testing, the technician will ask you to remember different types of material, solve problems, and answer some questionnaires. Your family member will not be present for this portion of the evaluation.   Please note: We must reserve several hours of the neuropsychologist's time and the psychometrician's time for your evaluation appointment. As such, there is a No-Show fee of $100. If you are unable to attend any of your appointments, please contact our office as soon as possible to reschedule.      DRIVING: Regarding driving, in patients with progressive memory problems, driving will be impaired. We advise to have someone else do the driving if trouble finding directions or if minor accidents are reported. Independent driving assessment is available to determine safety of driving.   If you are interested in the driving assessment, you can  contact the following:  [facility] [employer] [facility] in [place] [phone]  [facility] [phone]  [facility] [phone]  [facility] [phone] or [phone]   FALL  PRECAUTIONS: Be cautious when walking. Scan the area for obstacles that may increase the risk of trips and falls. When getting up in the mornings, sit up at the edge of the bed for a few minutes before getting out of bed. Consider elevating the bed at the head end to avoid drop of blood pressure when getting up. Walk always in a well-lit room (use night lights in the walls). Avoid area rugs or power cords from appliances in the middle of the walkways. Use a walker or a cane if necessary and consider physical therapy for balance exercise. Get your eyesight checked regularly.  FINANCIAL OVERSIGHT: Supervision, especially oversight when making financial decisions or transactions is also recommended.  HOME SAFETY: Consider the safety of the kitchen when operating appliances like stoves, microwave oven, and blender. Consider having supervision and share cooking responsibilities until no longer able to participate in those. Accidents with firearms and other hazards in the house should be identified and addressed as well.   ABILITY TO BE LEFT ALONE: If patient is unable to contact 911 operator, consider using LifeLine, or when the need is there, arrange for someone to stay with patients. Smoking is a fire hazard, consider supervision or cessation. Risk of wandering should be assessed by caregiver and if detected at any point, supervision and safe proof recommendations should be instituted.  MEDICATION SUPERVISION: Inability to self-administer medication needs to be constantly addressed. Implement a mechanism to ensure safe administration of the medications.      Mediterranean Diet A Mediterranean diet refers to food and lifestyle choices that are based on the traditions of countries located on the [employer] [place]. This way of eating has been shown to help prevent certain conditions and improve outcomes for people who have chronic diseases, like kidney disease and heart disease. What are tips for  following this plan? Lifestyle  Cook and eat meals together with your family, when possible. Drink enough fluid to keep your urine clear or pale yellow. Be physically active every day. This includes: Aerobic exercise like running or swimming. Leisure activities like gardening, walking, or housework. Get 7-8 hours of sleep each night. If recommended by your health care provider, drink red wine in moderation. This means 1 glass a day for nonpregnant women and 2 glasses a day for men. A glass of wine equals 5 oz (150 mL). Reading food labels  Check the serving size of packaged foods. For foods such as rice and pasta, the serving size refers to the amount of cooked product, not dry. Check the total fat in packaged foods. Avoid foods that have saturated fat or trans fats. Check the ingredients list for added sugars, such as corn syrup. Shopping  At the grocery store, buy most of your food from the areas near the walls of the store. This includes: Fresh fruits and vegetables (produce). Grains, beans, nuts, and seeds. Some of these may be available in unpackaged forms or large amounts (in bulk). Fresh seafood. Poultry and eggs. Low-fat dairy products. Buy whole ingredients instead of prepackaged foods. Buy fresh fruits and vegetables in-season from local farmers markets. Buy frozen fruits and vegetables in resealable bags. If you do not have access to quality fresh seafood, buy precooked frozen shrimp or canned fish, such as tuna, salmon, or sardines. Buy small amounts of raw or  cooked vegetables, salads, or olives from the deli or salad bar at your store. Stock your pantry so you always have certain foods on hand, such as olive oil, canned tuna, canned tomatoes, rice, pasta, and beans. Cooking  Cook foods with extra-virgin olive oil instead of using butter or other vegetable oils. Have meat as a side dish, and have vegetables or grains as your main dish. This means having meat in small portions  or adding small amounts of meat to foods like pasta or stew. Use beans or vegetables instead of meat in common dishes like chili or lasagna. Experiment with different cooking methods. Try roasting or broiling vegetables instead of steaming or sauteing them. Add frozen vegetables to soups, stews, pasta, or rice. Add nuts or seeds for added healthy fat at each meal. You can add these to yogurt, salads, or vegetable dishes. Marinate fish or vegetables using olive oil, lemon juice, garlic, and fresh herbs. Meal planning  Plan to eat 1 vegetarian meal one day each week. Try to work up to 2 vegetarian meals, if possible. Eat seafood 2 or more times a week. Have healthy snacks readily available, such as: Vegetable sticks with hummus. Greek yogurt. Fruit and nut trail mix. Eat balanced meals throughout the week. This includes: Fruit: 2-3 servings a day Vegetables: 4-5 servings a day Low-fat dairy: 2 servings a day Fish, poultry, or lean meat: 1 serving a day Beans and legumes: 2 or more servings a week Nuts and seeds: 1-2 servings a day Whole grains: 6-8 servings a day Extra-virgin olive oil: 3-4 servings a day Limit red meat and sweets to only a few servings a month What are my food choices? Mediterranean diet Recommended Grains: Whole-grain pasta. Brown rice. Bulgar wheat. Polenta. Couscous. Whole-wheat bread. [place] Made[place]. Vegetables: Artichokes. Beets. Broccoli. Cabbage. Carrots. Eggplant. Green beans. Chard. Kale. Spinach. Onions. Leeks. Peas. Squash. Tomatoes. Peppers. Radishes. Fruits: Apples. Apricots. Avocado. Berries. Bananas. Cherries. Dates. Figs. Grapes. Lemons. Melon. Oranges. Peaches. Plums. Pomegranate. Meats and other protein foods: Beans. Almonds. Sunflower seeds. Pine nuts. Peanuts. Cod. Salmon. Scallops. Shrimp. Tuna. Tilapia. Clams. Oysters. Eggs. Dairy: Low-fat milk. Cheese. Greek yogurt. Beverages: Water. Red wine. Herbal tea. Fats and oils: Extra virgin olive oil.  Avocado oil. Grape seed oil. Sweets and desserts: Greek yogurt with honey. Baked apples. Poached pears. Trail mix. Seasoning and other foods: Basil. Cilantro. Coriander. Cumin. Mint. Parsley. Sage. Rosemary. Tarragon. Garlic. Oregano. Thyme. Pepper. Balsalmic vinegar. Tahini. Hummus. Tomato sauce. Olives. Mushrooms. Limit these Grains: Prepackaged pasta or rice dishes. Prepackaged cereal with added sugar. Vegetables: Deep fried potatoes (french fries). Fruits: Fruit canned in syrup. Meats and other protein foods: Beef. Pork. Lamb. Poultry with skin. Hot dogs. [place]. Dairy: Ice cream. Sour cream. Whole milk. Beverages: Juice. Sugar-sweetened soft drinks. Beer. Liquor and spirits. Fats and oils: Butter. Canola oil. Vegetable oil. Beef fat (tallow). Lard. Sweets and desserts: Cookies. Cakes. Pies. Candy. Seasoning and other foods: Mayonnaise. Premade sauces and marinades. The items listed may not be a complete list. Talk with your dietitian about what dietary choices are right for you. Summary The Mediterranean diet includes both food and lifestyle choices. Eat a variety of fresh fruits and vegetables, beans, nuts, seeds, and whole grains. Limit the amount of red meat and sweets that you eat. Talk with your health care provider about whether it is safe for you to drink red wine in moderation. This means 1 glass a day for nonpregnant women and 2 glasses a day for men. A glass of  wine equals 5 oz (150 mL). This information is not intended to replace advice given to you by your health care provider. Make sure you discuss any questions you have with your health care provider. Document Released: 08/16/2015 Document Revised: 09/18/2015 Document Reviewed: 08/16/2015 Elsevier Interactive Patient Education  2017 [employer].

## 2024-02-12 ENCOUNTER — Ambulatory Visit: Payer: Self-pay | Admitting: Physician Assistant

## 2024-02-12 LAB — TSH: TSH: 2.57 m[IU]/L (ref 0.40–4.50)

## 2024-02-12 LAB — VITAMIN B12: Vitamin B-12: 194 pg/mL — ABNORMAL LOW (ref 200–1100)

## 2024-02-12 NOTE — Progress Notes (Signed)
 I advised patient of results, thanked me for calling

## 2024-02-26 ENCOUNTER — Other Ambulatory Visit

## 2024-03-14 ENCOUNTER — Ambulatory Visit (HOSPITAL_COMMUNITY)

## 2024-03-14 ENCOUNTER — Ambulatory Visit: Admitting: Surgery

## 2024-03-23 ENCOUNTER — Ambulatory Visit: Payer: Self-pay

## 2024-03-23 ENCOUNTER — Ambulatory Visit: Payer: Self-pay | Admitting: Physician Assistant

## 2024-05-12 ENCOUNTER — Ambulatory Visit: Payer: Self-pay | Admitting: Physician Assistant
